# Patient Record
Sex: Female | Born: 1955 | Race: Black or African American | Hispanic: No | Marital: Single | Smoking: Never smoker
Health system: Southern US, Community
[De-identification: ages and names within clinical notes are randomized; demographics above are authoritative.]

## PROBLEM LIST (undated history)

## (undated) DIAGNOSIS — N6019 Diffuse cystic mastopathy of unspecified breast: Secondary | ICD-10-CM

## (undated) DIAGNOSIS — K5904 Chronic idiopathic constipation: Secondary | ICD-10-CM

## (undated) HISTORY — PX: OTHER SURGICAL HISTORY: SHX169

## (undated) HISTORY — PX: ABDOMINAL HYSTERECTOMY: SHX81

## (undated) HISTORY — DX: Chronic idiopathic constipation: K59.04

## (undated) HISTORY — DX: Diffuse cystic mastopathy of unspecified breast: N60.19

## (undated) HISTORY — PX: BREAST EXCISIONAL BIOPSY: SUR124

## (undated) HISTORY — PX: KNEE ARTHROSCOPY: SUR90

---

## 1997-07-23 ENCOUNTER — Ambulatory Visit: Admission: RE | Admit: 1997-07-23 | Discharge: 1997-07-23 | Payer: Self-pay | Admitting: Internal Medicine

## 1998-10-22 ENCOUNTER — Ambulatory Visit (HOSPITAL_COMMUNITY): Admission: RE | Admit: 1998-10-22 | Discharge: 1998-10-22 | Payer: Self-pay | Admitting: Obstetrics and Gynecology

## 1999-11-08 ENCOUNTER — Encounter: Admission: RE | Admit: 1999-11-08 | Discharge: 1999-11-08 | Payer: Self-pay | Admitting: Internal Medicine

## 1999-11-08 ENCOUNTER — Encounter: Payer: Self-pay | Admitting: Internal Medicine

## 2000-11-09 ENCOUNTER — Encounter: Admission: RE | Admit: 2000-11-09 | Discharge: 2000-11-09 | Payer: Self-pay | Admitting: Obstetrics and Gynecology

## 2000-11-09 ENCOUNTER — Encounter: Payer: Self-pay | Admitting: Obstetrics and Gynecology

## 2000-11-15 ENCOUNTER — Encounter (INDEPENDENT_AMBULATORY_CARE_PROVIDER_SITE_OTHER): Payer: Self-pay | Admitting: Specialist

## 2000-11-15 ENCOUNTER — Inpatient Hospital Stay (HOSPITAL_COMMUNITY): Admission: RE | Admit: 2000-11-15 | Discharge: 2000-11-17 | Payer: Self-pay | Admitting: Obstetrics and Gynecology

## 2001-06-06 ENCOUNTER — Encounter: Admission: RE | Admit: 2001-06-06 | Discharge: 2001-06-06 | Payer: Self-pay | Admitting: Internal Medicine

## 2001-06-06 ENCOUNTER — Encounter: Payer: Self-pay | Admitting: Internal Medicine

## 2001-11-29 ENCOUNTER — Encounter: Payer: Self-pay | Admitting: Obstetrics and Gynecology

## 2001-11-29 ENCOUNTER — Encounter: Admission: RE | Admit: 2001-11-29 | Discharge: 2001-11-29 | Payer: Self-pay | Admitting: Obstetrics and Gynecology

## 2003-01-22 ENCOUNTER — Encounter: Admission: RE | Admit: 2003-01-22 | Discharge: 2003-01-22 | Payer: Self-pay | Admitting: Obstetrics and Gynecology

## 2004-02-11 ENCOUNTER — Ambulatory Visit (HOSPITAL_COMMUNITY): Admission: RE | Admit: 2004-02-11 | Discharge: 2004-02-11 | Payer: Self-pay | Admitting: Obstetrics and Gynecology

## 2004-02-19 ENCOUNTER — Encounter: Admission: RE | Admit: 2004-02-19 | Discharge: 2004-02-19 | Payer: Self-pay | Admitting: Obstetrics and Gynecology

## 2004-09-07 ENCOUNTER — Encounter: Admission: RE | Admit: 2004-09-07 | Discharge: 2004-09-07 | Payer: Self-pay | Admitting: Obstetrics and Gynecology

## 2005-02-25 ENCOUNTER — Encounter: Payer: Self-pay | Admitting: Cardiology

## 2005-02-25 ENCOUNTER — Ambulatory Visit: Payer: Self-pay

## 2005-03-07 ENCOUNTER — Encounter: Admission: RE | Admit: 2005-03-07 | Discharge: 2005-03-07 | Payer: Self-pay | Admitting: Obstetrics and Gynecology

## 2005-09-23 ENCOUNTER — Encounter: Admission: RE | Admit: 2005-09-23 | Discharge: 2005-09-23 | Payer: Self-pay | Admitting: Internal Medicine

## 2006-05-23 ENCOUNTER — Encounter: Admission: RE | Admit: 2006-05-23 | Discharge: 2006-05-23 | Payer: Self-pay | Admitting: Internal Medicine

## 2007-01-01 ENCOUNTER — Encounter: Admission: RE | Admit: 2007-01-01 | Discharge: 2007-01-01 | Payer: Self-pay | Admitting: Obstetrics and Gynecology

## 2007-09-09 ENCOUNTER — Ambulatory Visit: Payer: Self-pay | Admitting: Vascular Surgery

## 2007-09-09 ENCOUNTER — Encounter (INDEPENDENT_AMBULATORY_CARE_PROVIDER_SITE_OTHER): Payer: Self-pay | Admitting: Emergency Medicine

## 2007-09-09 ENCOUNTER — Emergency Department (HOSPITAL_COMMUNITY): Admission: EM | Admit: 2007-09-09 | Discharge: 2007-09-09 | Payer: Self-pay | Admitting: Emergency Medicine

## 2008-02-19 ENCOUNTER — Encounter: Admission: RE | Admit: 2008-02-19 | Discharge: 2008-02-19 | Payer: Self-pay | Admitting: Obstetrics and Gynecology

## 2009-01-01 ENCOUNTER — Ambulatory Visit: Payer: Self-pay | Admitting: Internal Medicine

## 2009-03-12 ENCOUNTER — Encounter: Admission: RE | Admit: 2009-03-12 | Discharge: 2009-03-12 | Payer: Self-pay | Admitting: Internal Medicine

## 2009-03-30 ENCOUNTER — Ambulatory Visit: Payer: Self-pay | Admitting: Internal Medicine

## 2010-04-19 ENCOUNTER — Other Ambulatory Visit: Payer: Self-pay | Admitting: *Deleted

## 2010-04-19 ENCOUNTER — Other Ambulatory Visit: Payer: Self-pay | Admitting: Obstetrics and Gynecology

## 2010-04-19 DIAGNOSIS — Z1239 Encounter for other screening for malignant neoplasm of breast: Secondary | ICD-10-CM

## 2010-05-07 ENCOUNTER — Ambulatory Visit
Admission: RE | Admit: 2010-05-07 | Discharge: 2010-05-07 | Disposition: A | Discharge status: Home / Self Care | Payer: BC Managed Care – PPO | Source: Ambulatory Visit | Attending: *Deleted | Admitting: *Deleted

## 2010-05-07 ENCOUNTER — Other Ambulatory Visit: Payer: Self-pay | Admitting: Internal Medicine

## 2010-05-07 DIAGNOSIS — Z1239 Encounter for other screening for malignant neoplasm of breast: Secondary | ICD-10-CM

## 2010-05-07 LAB — HM MAMMOGRAPHY

## 2010-08-06 NOTE — H&P (Signed)
[facility]  Patient:    Laura Meyers, SITZER Visit Number: [id number] MRN: [id number]          Service Type: Attending:  Katherine Roan, M.D. Dictated by:   S. Kyra Manges, M.D. Adm. Date:  11/15/00                           History and Physical  CHIEF COMPLAINT:  Uterine fibroids.  HISTORY OF PRESENT ILLNESS:  Ms. Vezina is a 55 year old, gravida 1, female who has a history of enlarging uterine fibroids to the point now that the uterus is about 14-week size which occupies the entire cul-de-sac and hysterectomy is recommended.  In August 2000 she had a hysteroscopy which revealed a smooth uterine cavity.  This was resected with little irregularity posteriorly which was resected.  This controlled her periods somewhat, though the uterus continues to enlarge and on last ultrasound the uterine measurements were 16 cm.  PAST MEDICAL HISTORY:  She has a history of right knee arthroscopy in 1999.  ALLERGIES:  No known drug allergies.  CURRENT MEDICATIONS:  None.  REVIEW OF SYSTEMS:  HEENT:  She wears glasses, but has noted no decrease in visual or auditory acuity.  HEART:  No hypertension, no mitral valve prolapse, no history of rheumatic fever or hypertension.  No chest pain.  LUNGS:  No chronic cough, no asthma, no history of hemoptysis or pneumonia.  GU:  No stress incontinence, frequency.  GI:  No bowel habit change, no melena, no anorexia, no history of reflux.  MUSCLES/BONES/JOINTS:  She has had an arthroscopy which revealed a meniscal tear according to the patient.  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  FAMILY HISTORY:  Her mother is deceased with breast cancer at 39 and father died at 31 of emphysema.  She has two brothers; one with hypertension and two sisters with hypertension.  No diabetes.  PHYSICAL EXAMINATION:  VITAL SIGNS:  Weight 197, blood pressure 120/80, pulse 80.  GENERAL:  Well-developed,  well-nourished female in no acute distress.  Alert and oriented to time, place, and recent events.  Appears to be her stated age.  HEENT:  Unremarkable.  Oropharynx is not injected.  NECK:  Supple.  Carotid pulses are equal without bruits.  Thyroid is not enlarged.  No adenopathy appreciated.  Good range of motion.  BREASTS:  No masses or tenderness.  LUNGS:  Clear to auscultation and percussion.  Diaphragm move well with inspiration and expiration.  HEART:  Normal sinus rhythm, no murmurs.  ABDOMEN:  Soft, liver, spleen, and kidney are not palpated.  The uterus is palpated about three fingerbreadths above the umbilicus.  Bowel sounds are normal, no tenderness, no rebound or guarding.  Femoral pulses are equal without bruits.  EXTREMITIES:  Good range of motion and equal pulses and reflexes.  PELVIC:  Normal vulva and vagina.  The cervix without lesions.  Recent Pap smear negative.  Uterus is enlarged to about 14-week size, nontender.  IMPRESSION:  Large uterine fibroids.  PLAN:  Abdominal hysterectomy.  Risks and benefits discussed with patient at length. Dictated by:   S. Kyra Manges, M.D. Attending:  Katherine Roan, M.D. DD:  11/15/00 TD:  11/15/00 Job: [id number]

## 2010-08-06 NOTE — Discharge Summary (Signed)
[facility]  Patient:    Laura Meyers, WITHERELL [facility] Visit Number: [id number] MRN: [id number]          Service Type: GYN Location: 4W 0448 01 Attending Physician:  Lendon Colonel Dictated by:   Kathie Rhodes. Kyra Manges, M.D. Admit Date:  11/15/2000 Discharge Date: 11/17/2000                             Discharge Summary  HISTORY OF PRESENT ILLNESS:  The patient is a 55 year old female with an enlarging uterine fibroid found, and surgery was recommended.  Pap smear was normal.  Preoperative hemoglobin was 12.6, hematocrit 37.  She was A+.  HOSPITAL COURSE:  The patient was admitted to the hospital, underwent an uneventful hysterectomy.  Total uterine weight was significant, well over 500 g.  Her postoperative course was uncomplicated.  She remained afebrile and without complaints.  On the day of discharge her incision was healing nicely, and she was afebrile and ambulating without difficulty.  She was given Percocet for pain.  Told to call for fever, or nausea or vomiting, or any other difficulties she could encounter.  She will return to the office in two weeks.  CONDITION ON DISCHARGE:  Improved. Dictated by:   S. Kyra Manges, M.D. Attending Physician:  Lendon Colonel DD:  11/23/00 TD:  11/24/00 Job: [id number]

## 2010-08-06 NOTE — Op Note (Signed)
[facility]  Patient:    Laura Meyers, Laura Meyers [facility] Visit Number: [id number] MRN: [id number]          Service Type: GYN Location: 4W 0448 01 Attending Physician:  Lendon Colonel Proc. Date: 11/15/00 Adm. Date:  11/15/2000                             Operative Report  PREOPERATIVE DIAGNOSIS:  Large uterine fibroids.  POSTOPERATIVE DIAGNOSIS:  Large uterine fibroids.  OPERATION PERFORMED:  Abdominal hysterectomy.  DESCRIPTION OF PROCEDURE:  The patient was placed in lithotomy position and prepped and draped in the usual fashion. A generous Pfannenstiel incision was made in the abdomen and extended in layers to the fascia which was incised transversely. Hemostasis with the Bovie. The peritoneum was entered vertically, upper abdomen explored and found to be within normal limits. Parietoperitoneum the Balfour retractor was inserted with the extender. The uterus was enlarged particularly with the left fundal fibroid and a left interligamentous fibroid. Because of visibility difficulties, I injected the uterus with a Pitressin solution and removed 2 or 3 fibroids so that we could facilitate removing the uterus. This was done with clamping the utero-ovarian anastomosis and transecting this along with the round ligament. A bladder flap was created. Uterine vessels were skeletonized and ligated. The cardinal and uterosacral ligaments were then clamped and ligated and the specimen was removed from the operative field. The vagina was transfixed with #0 chromic and #0 Vicryl and the uterosacral ligaments and cul-de-sac were plicated with 2-0 Ethibond. This affected and excellent vault support. Reperitonealization with 3-0 Vicryl. Hemostasis was secured. The parietoperitoneum was then closed with 2-0 PDS, the fascia was closed with 2-0 PDS continuous and interrupted sutures. The subcutaneum was approximated with 3-0 Vicryl and the skin was then closed with 4-0 PDS.  A subcuticular suture was employed. A dry sterile dressing was applied. The incision was infiltrated with 20 cc of 0.5% Marcaine with epinephrine. Caterine tolerated this procedure well and was sent to the recovery room in good condition. Attending Physician:  Lendon Colonel DD:  11/15/00 TD:  11/15/00 Job: [id number]

## 2011-02-09 ENCOUNTER — Encounter: Payer: Self-pay | Admitting: Internal Medicine

## 2011-02-09 ENCOUNTER — Ambulatory Visit (INDEPENDENT_AMBULATORY_CARE_PROVIDER_SITE_OTHER): Payer: BC Managed Care – PPO | Admitting: Internal Medicine

## 2011-02-09 VITALS — BP 110/74 | HR 76 | Temp 98.5°F | Ht 68.75 in | Wt 207.0 lb

## 2011-02-09 DIAGNOSIS — J069 Acute upper respiratory infection, unspecified: Secondary | ICD-10-CM

## 2011-02-09 NOTE — Progress Notes (Signed)
  Subjective:    Patient ID: Laura Meyers, female    DOB: 1955/11/02, 55 y.o.   MRN: [id number]  HPI 55 year old black female in today with URI symptoms. Initially had runny nose but now has cough and congestion in chest. No fever or shaking chills. Last URI was in 2010. General health is excellent. Says she was to have physical exam in January and screening colonoscopy. We will arrange colonoscopy for her in January. No sputum production. No sore throat or ear pain.    Review of Systems     Objective:   Physical Exam HEENT exam: TMs are slightly full but not red; pharynx is clear; neck is supple without significant adenopathy; chest is clear to auscultation without rales or wheezing        Assessment & Plan:  Upper respiratory infection  Plan: Zithromax Z-Pak 2 tablets by mouth day one followed by 1 tablet by mouth days 2 through 5 with 1 refill. If not better in one week have prescription refill. Samples of Rezira (5 doses) 1 teaspoon by mouth each bedtime for cough. Schedule physical examination for January and colonoscopy with Dr. Juanda Chance for January as well.

## 2011-02-09 NOTE — Patient Instructions (Signed)
Take Zithromax Z-PAK as directed. Not better in one week have prescription refill. Take cough syrup at bedtime for 5 days.

## 2011-02-15 ENCOUNTER — Telehealth: Payer: Self-pay

## 2011-02-15 ENCOUNTER — Encounter: Payer: Self-pay | Admitting: Internal Medicine

## 2011-02-15 NOTE — Telephone Encounter (Signed)
Patient scheduled for screening colonoscopy on March 25, 2011 at 1:30 pm with Dr, Diamond Nickel per her request. Patient informed

## 2011-03-11 ENCOUNTER — Encounter: Payer: Self-pay | Admitting: Internal Medicine

## 2011-03-11 ENCOUNTER — Ambulatory Visit (AMBULATORY_SURGERY_CENTER): Payer: BC Managed Care – PPO | Admitting: *Deleted

## 2011-03-11 VITALS — Ht 71.0 in | Wt 210.1 lb

## 2011-03-11 DIAGNOSIS — Z1211 Encounter for screening for malignant neoplasm of colon: Secondary | ICD-10-CM

## 2011-03-11 MED ORDER — PEG-KCL-NACL-NASULF-NA ASC-C 100 G PO SOLR: ORAL | Status: DC

## 2011-03-25 ENCOUNTER — Ambulatory Visit (AMBULATORY_SURGERY_CENTER): Payer: BC Managed Care – PPO | Admitting: Internal Medicine

## 2011-03-25 ENCOUNTER — Encounter: Payer: Self-pay | Admitting: Internal Medicine

## 2011-03-25 VITALS — BP 119/68 | HR 72 | Temp 98.7°F | Resp 12 | Ht 71.0 in | Wt 210.0 lb

## 2011-03-25 DIAGNOSIS — Z1211 Encounter for screening for malignant neoplasm of colon: Secondary | ICD-10-CM

## 2011-03-25 MED ORDER — SODIUM CHLORIDE 0.9 % IV SOLN: 500.0000 mL | INTRAVENOUS | Status: DC

## 2011-03-25 NOTE — Patient Instructions (Signed)
Follow green and blue discharge instructions, follow high fiber diet given with discharge instruction, repeat colonoscopy in 10 years.

## 2011-03-25 NOTE — Op Note (Signed)
[facility] [address] [place], [place]  [address]  COLONOSCOPY PROCEDURE REPORT  PATIENT:  Laura Meyers, Laura Meyers  MR#:  [id number] BIRTHDATE:  [birth year], 55 yrs. old  GENDER:  female ENDOSCOPIST:  Hedwig Morton. Juanda Chance, MD REF. BY:  Sharlet Salina, M.D. PROCEDURE DATE:  03/25/2011 PROCEDURE:  Colonoscopy 19147 ASA CLASS:  Class I INDICATIONS:  colorectal cancer screening, average risk MEDICATIONS:   These medications were titrated to patient response per physician's verbal order, Versed 9 mg, Fentanyl 75 mcg  DESCRIPTION OF PROCEDURE:   After the risks and benefits and of the procedure were explained, informed consent was obtained. Digital rectal exam was performed and revealed no rectal masses. The LB PCF-[id number] endoscope was introduced through the anus and advanced to the cecum, which was identified by both the appendix and ileocecal valve.  The quality of the prep was good, using MoviPrep.  The instrument was then slowly withdrawn as the colon was fully examined. <<PROCEDUREIMAGES>>  FINDINGS:  No polyps or cancers were seen (see image1, image2, image3, image4, and image5).   Retroflexed views in the rectum revealed no abnormalities.    The scope was then withdrawn from the patient and the procedure completed.  COMPLICATIONS:  None ENDOSCOPIC IMPRESSION: 1) No polyps or cancers 2) Normal colonoscopy RECOMMENDATIONS: 1) High fiber diet.  REPEAT EXAM:  In 10 year(s) for.  ______________________________ Hedwig Morton. Juanda Chance, MD  CC:  n. eSIGNED:   Hedwig Morton. Brodie at 03/25/2011 02:05 PM  Jeremy Johann, [phone]

## 2011-03-25 NOTE — Progress Notes (Signed)
Patient did not experience any of the following events: a burn prior to discharge; a fall within the facility; wrong site/side/patient/procedure/implant event; or a hospital transfer or hospital admission upon discharge from the facility. (G8907) Patient did not have preoperative order for IV antibiotic SSI prophylaxis. (G8918)  

## 2011-03-28 ENCOUNTER — Telehealth: Payer: Self-pay

## 2011-03-28 NOTE — Telephone Encounter (Signed)
Follow up Call- Patient questions:  Do you have a fever, pain , or abdominal swelling? no Pain Score  0 *  Have you tolerated food without any problems? yes  Have you been able to return to your normal activities? yes  Do you have any questions about your discharge instructions: Diet   no Medications  no Follow up visit  no  Do you have questions or concerns about your Care? no  Actions: * If pain score is 4 or above: No action needed, pain <4.  "Everything went fine, I'm already at work" per the pt. Laura Meyers

## 2011-03-30 ENCOUNTER — Other Ambulatory Visit: Payer: BC Managed Care – PPO | Admitting: Internal Medicine

## 2011-03-31 ENCOUNTER — Encounter: Payer: BC Managed Care – PPO | Admitting: Internal Medicine

## 2011-04-18 ENCOUNTER — Other Ambulatory Visit: Payer: Self-pay | Admitting: Internal Medicine

## 2011-04-18 ENCOUNTER — Encounter: Payer: Self-pay | Admitting: Internal Medicine

## 2011-04-18 ENCOUNTER — Ambulatory Visit (INDEPENDENT_AMBULATORY_CARE_PROVIDER_SITE_OTHER): Payer: BC Managed Care – PPO | Admitting: Internal Medicine

## 2011-04-18 VITALS — BP 118/78 | HR 76 | Ht 69.5 in | Wt 210.0 lb

## 2011-04-18 DIAGNOSIS — G4762 Sleep related leg cramps: Secondary | ICD-10-CM

## 2011-04-18 DIAGNOSIS — Z8639 Personal history of other endocrine, nutritional and metabolic disease: Secondary | ICD-10-CM

## 2011-04-18 DIAGNOSIS — N6019 Diffuse cystic mastopathy of unspecified breast: Secondary | ICD-10-CM

## 2011-04-18 DIAGNOSIS — Z1272 Encounter for screening for malignant neoplasm of vagina: Secondary | ICD-10-CM

## 2011-04-18 DIAGNOSIS — Z0000 Encounter for general adult medical examination without abnormal findings: Secondary | ICD-10-CM

## 2011-04-18 DIAGNOSIS — K5904 Chronic idiopathic constipation: Secondary | ICD-10-CM

## 2011-04-18 LAB — COMPREHENSIVE METABOLIC PANEL
AST: 19 U/L (ref 0–37)
Alkaline Phosphatase: 78 U/L (ref 39–117)
BUN: 11 mg/dL (ref 6–23)
Calcium: 9.9 mg/dL (ref 8.4–10.5)
Chloride: 104 mEq/L (ref 96–112)
Creat: 0.71 mg/dL (ref 0.50–1.10)
Glucose, Bld: 88 mg/dL (ref 70–99)

## 2011-04-18 LAB — LIPID PANEL
HDL: 87 mg/dL (ref >39)
Total CHOL/HDL Ratio: 2.5 Ratio
Triglycerides: 56 mg/dL (ref <150)

## 2011-04-18 LAB — POCT URINALYSIS DIPSTICK
Blood, UA: NEGATIVE
Ketones, UA: NEGATIVE
Protein, UA: NEGATIVE
Spec Grav, UA: 1.015
pH, UA: 6

## 2011-04-18 LAB — TSH: TSH: 2.452 u[IU]/mL (ref 0.350–4.500)

## 2011-04-18 LAB — CBC WITH DIFFERENTIAL/PLATELET
Basophils Absolute: 0 10*3/uL (ref 0.0–0.1)
Basophils Relative: 0 % (ref 0–1)
Eosinophils Relative: 1 % (ref 0–5)
HCT: 41.7 % (ref 36.0–46.0)
MCHC: 31.9 g/dL (ref 30.0–36.0)
MCV: 74.2 fL — ABNORMAL LOW (ref 78.0–100.0)
Monocytes Absolute: 0.3 10*3/uL (ref 0.1–1.0)
RDW: 15 % (ref 11.5–15.5)

## 2011-04-18 NOTE — Progress Notes (Signed)
  Subjective:    Patient ID: Laura Meyers, female    DOB: Aug 25, 1955, 56 y.o.   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 Hx of functional constipation ,fibrocystic breast disease, Vitamin D deficiency and musculoskeletal pain. Recent colonoscopy dine 03/25/11. Declines flu vaccine. Tdap given Jan 2011. Mammogram due 2/13. NKDA Hx hysterectomy without oophorectomy for fibroids 2002. Benign Right breast biopsy 2002. Hx surgery for left hammertoe. 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 Thurston Hole July 1995. Divorced. No children. Does not smoke or consume alcohol. Family History: Mother died of breast cancer in her 39's with history of hypertension. Sister with hypertension.. Father died of COPD in his 42's.Tota lof 2 brothers and 2 sisters.    Review of Systems  Constitutional: Negative.   HENT: Negative.   Eyes: Negative.   Respiratory: Negative.   Cardiovascular: Negative.   Genitourinary: Negative.   Musculoskeletal:       Leg cramps at night.  Neurological:       History of benign positional vertigo.  Hematological: Negative.   Psychiatric/Behavioral: Negative.        Objective:   Physical Exam  Vitals reviewed. Constitutional: She is oriented to person, place, and time. She appears well-developed and well-nourished.  HENT:  Head: Normocephalic and atraumatic.  Right Ear: External ear normal.  Left Ear: External ear normal.  Mouth/Throat: Oropharynx is clear and moist.  Eyes: Conjunctivae and EOM are normal. Pupils are equal, round, and reactive to light. Right eye exhibits no discharge. Left eye exhibits no discharge. No scleral icterus.  Neck: Normal range of motion. Neck supple. No JVD present. No thyromegaly present.  Cardiovascular: Normal rate, regular rhythm, normal heart sounds and intact distal pulses.   Pulmonary/Chest: No respiratory distress. She has no wheezes. She has  no rales. She exhibits no tenderness.       Breasts normal female with fibrocystic changes and no masses.  Abdominal: Soft. Bowel sounds are normal. She exhibits no distension and no mass. There is no tenderness. There is no rebound and no guarding.  Genitourinary:       Pap taken of vaginal cuff; no masses on bimanual exam; a stitch was removed from inside the vagina which was likely a remnant of her previous hysterectomy that worked its way out  Musculoskeletal: Normal range of motion. She exhibits no edema.  Lymphadenopathy:    She has no cervical adenopathy.  Neurological: She is alert and oriented to person, place, and time. She has normal reflexes. She displays normal reflexes. No cranial nerve deficit. Coordination normal.  Skin: Skin is warm and dry.  Psychiatric: She has a normal mood and affect. Her behavior is normal. Judgment and thought content normal.          Assessment & Plan:  Fibrocystic breast disease and family Hx breast cancer in mother Functional constipation  Nocturnal leg cramps Hx benign positional vertigo Hx of musculoskeletal pain Hx of Vitamin D deficiency Plan: mammogram due Feb 2013, Check Vitamin D level with hx leg cramps. Leg cramps are benign spasms but can be treated with Mirapex if necessary. Return one year or as needed.

## 2011-04-19 ENCOUNTER — Other Ambulatory Visit (HOSPITAL_COMMUNITY)
Admission: RE | Admit: 2011-04-19 | Discharge: 2011-04-19 | Disposition: A | Discharge status: Home / Self Care | Payer: BC Managed Care – PPO | Source: Ambulatory Visit | Attending: Internal Medicine | Admitting: Internal Medicine

## 2011-04-19 DIAGNOSIS — Z01419 Encounter for gynecological examination (general) (routine) without abnormal findings: Secondary | ICD-10-CM | POA: Insufficient documentation

## 2011-04-19 LAB — IRON AND TIBC: TIBC: 306 ug/dL (ref 250–470)

## 2011-05-06 ENCOUNTER — Telehealth: Payer: Self-pay

## 2011-05-06 NOTE — Telephone Encounter (Signed)
Patient is requesting a copy of her PE done on 01/28 2013

## 2011-05-07 ENCOUNTER — Encounter: Payer: Self-pay | Admitting: Internal Medicine

## 2011-05-07 DIAGNOSIS — K5904 Chronic idiopathic constipation: Secondary | ICD-10-CM | POA: Insufficient documentation

## 2011-05-07 DIAGNOSIS — N6019 Diffuse cystic mastopathy of unspecified breast: Secondary | ICD-10-CM | POA: Insufficient documentation

## 2011-05-07 DIAGNOSIS — Z8639 Personal history of other endocrine, nutritional and metabolic disease: Secondary | ICD-10-CM | POA: Insufficient documentation

## 2011-05-07 DIAGNOSIS — G4762 Sleep related leg cramps: Secondary | ICD-10-CM | POA: Insufficient documentation

## 2011-05-07 NOTE — Patient Instructions (Signed)
Call if leg cramps persist. Obtain mammogram Feb 2013. Continue same meds. Return in one year.

## 2011-05-09 NOTE — Telephone Encounter (Signed)
Note completed 

## 2011-06-24 ENCOUNTER — Other Ambulatory Visit: Payer: Self-pay | Admitting: Internal Medicine

## 2011-06-24 DIAGNOSIS — Z1231 Encounter for screening mammogram for malignant neoplasm of breast: Secondary | ICD-10-CM

## 2011-07-01 ENCOUNTER — Ambulatory Visit: Payer: BC Managed Care – PPO

## 2013-01-15 ENCOUNTER — Other Ambulatory Visit: Payer: Self-pay

## 2013-01-15 DIAGNOSIS — Z1231 Encounter for screening mammogram for malignant neoplasm of breast: Secondary | ICD-10-CM

## 2013-02-22 ENCOUNTER — Ambulatory Visit
Admission: RE | Admit: 2013-02-22 | Discharge: 2013-02-22 | Disposition: A | Discharge status: Home / Self Care | Payer: BC Managed Care – PPO | Source: Ambulatory Visit

## 2013-02-22 DIAGNOSIS — Z1231 Encounter for screening mammogram for malignant neoplasm of breast: Secondary | ICD-10-CM

## 2015-02-16 ENCOUNTER — Other Ambulatory Visit: Payer: Self-pay

## 2015-02-16 DIAGNOSIS — Z1231 Encounter for screening mammogram for malignant neoplasm of breast: Secondary | ICD-10-CM

## 2015-02-20 ENCOUNTER — Ambulatory Visit
Admission: RE | Admit: 2015-02-20 | Discharge: 2015-02-20 | Disposition: A | Discharge status: Home / Self Care | Payer: BC Managed Care – PPO | Source: Ambulatory Visit

## 2015-02-20 DIAGNOSIS — Z1231 Encounter for screening mammogram for malignant neoplasm of breast: Secondary | ICD-10-CM

## 2015-03-02 ENCOUNTER — Ambulatory Visit (INDEPENDENT_AMBULATORY_CARE_PROVIDER_SITE_OTHER): Payer: BC Managed Care – PPO | Admitting: Podiatry

## 2015-03-02 ENCOUNTER — Ambulatory Visit (INDEPENDENT_AMBULATORY_CARE_PROVIDER_SITE_OTHER): Payer: BC Managed Care – PPO

## 2015-03-02 ENCOUNTER — Encounter: Payer: Self-pay | Admitting: Podiatry

## 2015-03-02 VITALS — BP 132/72 | HR 70 | Resp 16 | Ht 71.0 in | Wt 180.0 lb

## 2015-03-02 DIAGNOSIS — M79671 Pain in right foot: Secondary | ICD-10-CM | POA: Diagnosis not present

## 2015-03-02 DIAGNOSIS — M779 Enthesopathy, unspecified: Secondary | ICD-10-CM | POA: Diagnosis not present

## 2015-03-02 DIAGNOSIS — M2042 Other hammer toe(s) (acquired), left foot: Secondary | ICD-10-CM

## 2015-03-02 MED ORDER — TRIAMCINOLONE ACETONIDE 10 MG/ML IJ SUSP
10.0000 mg | Freq: Once | INTRAMUSCULAR | Status: AC
  Administered 2015-03-02: 10 mg

## 2015-03-02 NOTE — Progress Notes (Signed)
   Subjective:    Patient ID: Laura Meyers, female    DOB: 06-07-1955, 59 y.o.   MRN: [id number]  HPI Patient presents with foot pain in their left foot, ball of foot. Pt stated, "feels bones when walking on foot"; x3 months.  Patient also presents with warts on their right foot, bottom of their foot. x6 months.   Review of Systems  All other systems reviewed and are negative.      Objective:   Physical Exam        Assessment & Plan:

## 2015-03-03 NOTE — Progress Notes (Signed)
Subjective:     Patient ID: Laura Meyers, female   DOB: 20-Mar-1956, 59 y.o.   MRN: [id number]  HPI patient presents stating I'm having a lot of pain on the bottom of my left foot for several months and I have all these lesions on the bottom of the right foot that I don't know what they are   Review of Systems  All other systems reviewed and are negative.      Objective:   Physical Exam  Constitutional: She is oriented to person, place, and time.  Cardiovascular: Intact distal pulses.   Musculoskeletal: Normal range of motion.  Neurological: She is oriented to person, place, and time.  Skin: Skin is warm.  Nursing note and vitals reviewed.  neurovascular status intact muscle strength adequate range of motion within normal limits with patient noted to have quite a bit of inflammation around the second MPJ of the left foot with fluid buildup with minimal displacement of the digit. Right foot shows numerous keratotic lesions that have lucent-type core worse and patient is noted to be well oriented 3 with good digital perfusion     Assessment:     Inflammatory capsulitis left foot with fluid buildup and porokeratotic type lesions left    Plan:     H&P and both conditions discussed and education rendered along with x-ray evaluation discussed. Today I did a proximal nerve block of left explaining risk of capsular injection aspirated the joint getting out a small amount of clear fluid and injected with a quarter cc dexamethasone Kenalog and talked about the possibility for long-term orthotic therapy. Dispensed pads with instructions on usage and reappoint to recheck

## 2015-03-18 ENCOUNTER — Encounter: Payer: Self-pay | Admitting: Podiatry

## 2015-03-18 ENCOUNTER — Ambulatory Visit (INDEPENDENT_AMBULATORY_CARE_PROVIDER_SITE_OTHER): Payer: BC Managed Care – PPO | Admitting: Podiatry

## 2015-03-18 VITALS — BP 114/75 | HR 69 | Resp 16

## 2015-03-18 DIAGNOSIS — M2042 Other hammer toe(s) (acquired), left foot: Secondary | ICD-10-CM | POA: Diagnosis not present

## 2015-03-18 DIAGNOSIS — M779 Enthesopathy, unspecified: Secondary | ICD-10-CM

## 2015-03-18 DIAGNOSIS — M79671 Pain in right foot: Secondary | ICD-10-CM | POA: Diagnosis not present

## 2015-03-18 MED ORDER — DICLOFENAC SODIUM 75 MG PO TBEC: 75.0000 mg | DELAYED_RELEASE_TABLET | Freq: Two times a day (BID) | ORAL | Status: DC

## 2015-03-19 NOTE — Progress Notes (Signed)
Subjective:     Patient ID: Laura Meyers, female   DOB: 08/18/1955, 59 y.o.   MRN: [id number]  HPI patient states I'm still having a lot of pain in my left foot with improvement after you gave me the injection but it feels like all the bones are sore and this is been going on now for several months.   Review of Systems     Objective:   Physical Exam Neurovascular status unchanged with continued discomfort in the left forefoot around the third metatarsal phalangeal joint moderate in the second with diminished fat pad noted and pain to palpation of the metatarsals themselves    Assessment:     Acute inflammatory capsulitis left over right with structural changes leading to excess pressure against the metatarsals    Plan:     H&P and conditions reviewed with patient. Today I have recommended immobilization to reduce all plantar pressures and I applied air fracture walker to provide for a complete rigid plantar surface and not allow and the bending forces occurring from the leg. I then went ahead and scanned for custom orthotics to try to reduce plantar pressure and patient will be seen back when they are ready or earlier if any issues should occur

## 2015-03-27 ENCOUNTER — Other Ambulatory Visit: Payer: BC Managed Care – PPO | Admitting: Internal Medicine

## 2015-03-27 DIAGNOSIS — Z13220 Encounter for screening for lipoid disorders: Secondary | ICD-10-CM

## 2015-03-27 DIAGNOSIS — Z130 Encounter for screening for diseases of the blood and blood-forming organs and certain disorders involving the immune mechanism: Secondary | ICD-10-CM

## 2015-03-27 DIAGNOSIS — Z0000 Encounter for general adult medical examination without abnormal findings: Secondary | ICD-10-CM

## 2015-03-27 DIAGNOSIS — E559 Vitamin D deficiency, unspecified: Secondary | ICD-10-CM

## 2015-03-27 LAB — COMPLETE METABOLIC PANEL WITH GFR
ALT: 20 U/L (ref 6–29)
AST: 26 U/L (ref 10–35)
Albumin: 4.1 g/dL (ref 3.6–5.1)
Alkaline Phosphatase: 78 U/L (ref 33–130)
BUN: 18 mg/dL (ref 7–25)
CHLORIDE: 104 mmol/L (ref 98–110)
CO2: 26 mmol/L (ref 20–31)
Calcium: 9.6 mg/dL (ref 8.6–10.4)
Creat: 0.8 mg/dL (ref 0.50–1.05)
GFR, Est African American: >89 mL/min (ref >=60)
GFR, Est Non African American: 82 mL/min (ref >=60)
GLUCOSE: 89 mg/dL (ref 65–99)
POTASSIUM: 4.2 mmol/L (ref 3.5–5.3)
SODIUM: 140 mmol/L (ref 135–146)
Total Bilirubin: 0.6 mg/dL (ref 0.2–1.2)
Total Protein: 7.1 g/dL (ref 6.1–8.1)

## 2015-03-27 LAB — CBC WITH DIFFERENTIAL/PLATELET
BASOS ABS: 0 10*3/uL (ref 0.0–0.1)
Basophils Relative: 0 % (ref 0–1)
Eosinophils Absolute: 0.1 10*3/uL (ref 0.0–0.7)
Eosinophils Relative: 1 % (ref 0–5)
HEMATOCRIT: 38.9 % (ref 36.0–46.0)
HEMOGLOBIN: 12.4 g/dL (ref 12.0–15.0)
LYMPHS ABS: 1.2 10*3/uL (ref 0.7–4.0)
LYMPHS PCT: 16 % (ref 12–46)
MCH: 23.7 pg — ABNORMAL LOW (ref 26.0–34.0)
MCHC: 31.9 g/dL (ref 30.0–36.0)
MCV: 74.2 fL — ABNORMAL LOW (ref 78.0–100.0)
MPV: 10 fL (ref 8.6–12.4)
Monocytes Absolute: 0.3 10*3/uL (ref 0.1–1.0)
Monocytes Relative: 4 % (ref 3–12)
NEUTROS ABS: 6.2 10*3/uL (ref 1.7–7.7)
Neutrophils Relative %: 79 % — ABNORMAL HIGH (ref 43–77)
Platelets: 248 10*3/uL (ref 150–400)
RBC: 5.24 MIL/uL — ABNORMAL HIGH (ref 3.87–5.11)
RDW: 15.9 % — ABNORMAL HIGH (ref 11.5–15.5)
WBC: 7.8 10*3/uL (ref 4.0–10.5)

## 2015-03-27 LAB — LIPID PANEL
CHOL/HDL RATIO: 2 ratio (ref <=5.0)
Cholesterol: 194 mg/dL (ref 125–200)
HDL: 96 mg/dL (ref >=46)
LDL CALC: 88 mg/dL (ref <130)
Triglycerides: 48 mg/dL (ref <150)
VLDL: 10 mg/dL (ref <30)

## 2015-03-27 LAB — TSH: TSH: 2.352 u[IU]/mL (ref 0.350–4.500)

## 2015-03-28 LAB — VITAMIN D 25 HYDROXY (VIT D DEFICIENCY, FRACTURES): VIT D 25 HYDROXY: 22 ng/mL — AB (ref 30–100)

## 2015-03-30 ENCOUNTER — Ambulatory Visit (INDEPENDENT_AMBULATORY_CARE_PROVIDER_SITE_OTHER): Payer: BC Managed Care – PPO | Admitting: Internal Medicine

## 2015-03-30 ENCOUNTER — Other Ambulatory Visit (HOSPITAL_COMMUNITY)
Admission: RE | Admit: 2015-03-30 | Discharge: 2015-03-30 | Disposition: A | Discharge status: Home / Self Care | Payer: BC Managed Care – PPO | Source: Ambulatory Visit | Attending: Internal Medicine | Admitting: Internal Medicine

## 2015-03-30 ENCOUNTER — Encounter: Payer: Self-pay | Admitting: Internal Medicine

## 2015-03-30 VITALS — BP 124/70 | HR 74 | Temp 97.7°F | Resp 20 | Ht 71.0 in | Wt 209.0 lb

## 2015-03-30 DIAGNOSIS — R718 Other abnormality of red blood cells: Secondary | ICD-10-CM | POA: Diagnosis not present

## 2015-03-30 DIAGNOSIS — Z0000 Encounter for general adult medical examination without abnormal findings: Secondary | ICD-10-CM

## 2015-03-30 DIAGNOSIS — Z1272 Encounter for screening for malignant neoplasm of vagina: Secondary | ICD-10-CM | POA: Diagnosis not present

## 2015-03-30 DIAGNOSIS — E559 Vitamin D deficiency, unspecified: Secondary | ICD-10-CM

## 2015-03-30 DIAGNOSIS — Z01419 Encounter for gynecological examination (general) (routine) without abnormal findings: Secondary | ICD-10-CM | POA: Diagnosis present

## 2015-03-30 DIAGNOSIS — D563 Thalassemia minor: Secondary | ICD-10-CM

## 2015-04-01 LAB — CYTOLOGY - PAP

## 2015-04-14 ENCOUNTER — Ambulatory Visit: Payer: BC Managed Care – PPO | Admitting: *Deleted

## 2015-04-14 DIAGNOSIS — M779 Enthesopathy, unspecified: Secondary | ICD-10-CM

## 2015-04-14 NOTE — Progress Notes (Signed)
Patient ID: Laura Meyers, female   DOB: 16-Nov-1955, 60 y.o.   MRN: [id number] Patient presents for orthotic pick up.  Verbal and written break in and wear instructions given.  Patient will follow up in 4 weeks if symptoms worsen or fail to improve.

## 2015-04-14 NOTE — Patient Instructions (Signed)

## 2015-04-19 ENCOUNTER — Encounter: Payer: Self-pay | Admitting: Internal Medicine

## 2015-04-19 NOTE — Patient Instructions (Signed)
It was a pleasure to see you today. Take 2000 units vitamin D 3 daily. Please have annual mammogram. Return in one year.

## 2015-04-19 NOTE — Progress Notes (Signed)
   Subjective:    Patient ID: Laura Meyers, female    DOB: [birth year], 60 y.o.   MRN: [id number]  HPI Patient has not been here since 2013 at which time she had physical examination. She says in the interim her general health has been excellent. No new complaints. In 2013 she had a history of vitamin D deficiency, nocturnal leg cramps, fibrocystic breast disease, functional constipation.  She had colonoscopy 03/25/2011. She declines flu vaccine. Tetanus immunization given January 2011.  No known drug allergies  Social history: She is divorced. No children. Does not smoke or consume alcohol. 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  History of hysterectomy without oophorectomy for fibroids 2002. Benign right breast biopsy 2002. History of surgery for left hammertoe. 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 Noemi Chapel July 1995.  Family history: Mother died of breast cancer in her 40s with history of hypertension. Sister with hypertension. Father died of COPD in his 94s. Total of 2 brothers and 2 sisters.   In 2013 she had Pap taken of vaginal cuff. No masses on bimanual exam. A stitch was removed from inside vagina which was likely a remnant of her previous hysterectomy that worked its way out.  Remote history of benign positional vertigo    Review of Systems  Constitutional: Negative.   All other systems reviewed and are negative.       Objective:   Physical Exam  Constitutional: She is oriented to person, place, and time. She appears well-developed and well-nourished. No distress.  HENT:  Head: Normocephalic and atraumatic.  Right Ear: External ear normal.  Mouth/Throat: Oropharynx is clear and moist. No oropharyngeal exudate.  Eyes: Conjunctivae and EOM are normal. Pupils are  equal, round, and reactive to light. Right eye exhibits no discharge. Left eye exhibits no discharge. No scleral icterus.  Neck: Neck supple. No JVD present. No thyromegaly present.  Cardiovascular: Normal rate, regular rhythm, normal heart sounds and intact distal pulses.   No murmur heard. Pulmonary/Chest: Effort normal and breath sounds normal. No respiratory distress. She has no wheezes. She has no rales.  Breasts normal female without masses  Abdominal: Bowel sounds are normal. She exhibits no distension and no mass. There is no tenderness. There is no rebound and no guarding.  Genitourinary:  Pap taken of vaginal cuff. Bimanual is normal.  Musculoskeletal: She exhibits no edema.  Lymphadenopathy:    She has no cervical adenopathy.  Neurological: She is alert and oriented to person, place, and time. She has normal reflexes. No cranial nerve deficit.  Skin: Skin is warm and dry. No rash noted. She is not diaphoretic.  Psychiatric: She has a normal mood and affect. Her behavior is normal. Judgment and thought content normal.  Vitals reviewed.         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 Return in one year or as needed. Recommend annual mammogram.

## 2016-02-23 ENCOUNTER — Other Ambulatory Visit: Payer: Self-pay | Admitting: Internal Medicine

## 2016-02-23 DIAGNOSIS — Z1231 Encounter for screening mammogram for malignant neoplasm of breast: Secondary | ICD-10-CM

## 2016-04-01 ENCOUNTER — Ambulatory Visit
Admission: RE | Admit: 2016-04-01 | Discharge: 2016-04-01 | Disposition: A | Discharge status: Home / Self Care | Payer: BC Managed Care – PPO | Source: Ambulatory Visit | Attending: Internal Medicine | Admitting: Internal Medicine

## 2016-04-01 DIAGNOSIS — Z1231 Encounter for screening mammogram for malignant neoplasm of breast: Secondary | ICD-10-CM

## 2016-04-18 ENCOUNTER — Encounter: Payer: Self-pay | Admitting: Internal Medicine

## 2016-04-18 ENCOUNTER — Ambulatory Visit (INDEPENDENT_AMBULATORY_CARE_PROVIDER_SITE_OTHER): Payer: BC Managed Care – PPO | Admitting: Internal Medicine

## 2016-04-18 VITALS — BP 110/60 | HR 60 | Temp 98.7°F | Wt 187.0 lb

## 2016-04-18 DIAGNOSIS — Z202 Contact with and (suspected) exposure to infections with a predominantly sexual mode of transmission: Secondary | ICD-10-CM

## 2016-04-18 DIAGNOSIS — N898 Other specified noninflammatory disorders of vagina: Secondary | ICD-10-CM | POA: Diagnosis not present

## 2016-04-18 LAB — POC URINALSYSI DIPSTICK (AUTOMATED)
BILIRUBIN UA: NEGATIVE
Glucose, UA: NEGATIVE
KETONES UA: NEGATIVE
Leukocytes, UA: NEGATIVE
Nitrite, UA: NEGATIVE
PH UA: 7
PROTEIN UA: NEGATIVE
SPEC GRAV UA: 1.015
Urobilinogen, UA: NEGATIVE

## 2016-04-18 NOTE — Progress Notes (Signed)
   Subjective:    Patient ID: Laura Meyers, female    DOB: [birth year], 61 y.o.   MRN: [id number]  HPI One episode of unprotected intercourse recently. Prior to this had not been sexually active in several years. Now has vaginal discharge and has noticed some spotting. Has been menopausal for a number of years. No lesions consistent with Herpes simplex.No pain or dysuria. Paranoid discharge was a bit thick and whitish without significant odor. No itching.    Review of Systems     Objective:   Physical Exam Cervix is friable. GC and chlamydia probes taken. Unable to do wet prep due to blood in and around cervix. There does seem to be some cream-colored vaginal discharge me next in with a blood       Assessment & Plan:  Possible STD  Possible vaginitis  Plan: GC and chlamydia probes pending. Await results.

## 2016-04-18 NOTE — Patient Instructions (Addendum)
Await GC and chlamydia probes before proceeding with treatment.

## 2016-04-19 ENCOUNTER — Encounter: Payer: Self-pay | Admitting: Internal Medicine

## 2016-04-19 ENCOUNTER — Telehealth: Payer: Self-pay | Admitting: Internal Medicine

## 2016-04-19 LAB — GC/CHLAMYDIA PROBE AMP
CT Probe RNA: NOT DETECTED
GC Probe RNA: NOT DETECTED

## 2016-04-19 MED ORDER — METRONIDAZOLE 500 MG PO TABS: 500.0000 mg | ORAL_TABLET | Freq: Two times a day (BID) | ORAL | 0 refills | Status: DC

## 2016-04-19 NOTE — Telephone Encounter (Signed)
GC and chlamydia probes are negative. Patient says vaginal discharge has no odor and does not itch. Spotting last today. I'm going to call in for her Flagyl 500 mg twice daily for 7 days. Was really not able to do a good wet prep because of spotting. If spotting continues she is to let me know. She may need to see GYN regarding endometrial biopsy.

## 2016-05-06 ENCOUNTER — Other Ambulatory Visit: Payer: BC Managed Care – PPO | Admitting: Internal Medicine

## 2016-05-06 DIAGNOSIS — Z0000 Encounter for general adult medical examination without abnormal findings: Secondary | ICD-10-CM

## 2016-05-06 DIAGNOSIS — E559 Vitamin D deficiency, unspecified: Secondary | ICD-10-CM

## 2016-05-06 DIAGNOSIS — Z13220 Encounter for screening for lipoid disorders: Secondary | ICD-10-CM

## 2016-05-06 DIAGNOSIS — Z1329 Encounter for screening for other suspected endocrine disorder: Secondary | ICD-10-CM

## 2016-05-06 LAB — COMPREHENSIVE METABOLIC PANEL
ALK PHOS: 72 U/L (ref 33–130)
ALT: 18 U/L (ref 6–29)
AST: 23 U/L (ref 10–35)
Albumin: 3.8 g/dL (ref 3.6–5.1)
BUN: 15 mg/dL (ref 7–25)
CO2: 25 mmol/L (ref 20–31)
CREATININE: 0.81 mg/dL (ref 0.50–1.05)
Calcium: 9.7 mg/dL (ref 8.6–10.4)
Chloride: 104 mmol/L (ref 98–110)
GLUCOSE: 86 mg/dL (ref 65–99)
POTASSIUM: 4.1 mmol/L (ref 3.5–5.3)
SODIUM: 139 mmol/L (ref 135–146)
Total Bilirubin: 0.6 mg/dL (ref 0.2–1.2)
Total Protein: 7 g/dL (ref 6.1–8.1)

## 2016-05-06 LAB — LIPID PANEL
CHOL/HDL RATIO: 2 ratio (ref <5.0)
Cholesterol: 174 mg/dL (ref <200)
HDL: 86 mg/dL (ref >50)
LDL Cholesterol: 79 mg/dL (ref <100)
Triglycerides: 47 mg/dL (ref <150)
VLDL: 9 mg/dL (ref <30)

## 2016-05-06 LAB — CBC WITH DIFFERENTIAL/PLATELET
BASOS PCT: 1 %
Basophils Absolute: 72 cells/uL (ref 0–200)
EOS PCT: 1 %
Eosinophils Absolute: 72 cells/uL (ref 15–500)
HCT: 38.3 % (ref 35.0–45.0)
Hemoglobin: 12.1 g/dL (ref 11.7–15.5)
LYMPHS PCT: 17 %
Lymphs Abs: 1224 cells/uL (ref 850–3900)
MCH: 24.2 pg — ABNORMAL LOW (ref 27.0–33.0)
MCHC: 31.6 g/dL — ABNORMAL LOW (ref 32.0–36.0)
MCV: 76.8 fL — AB (ref 80.0–100.0)
MONOS PCT: 4 %
MPV: 10.2 fL (ref 7.5–12.5)
Monocytes Absolute: 288 cells/uL (ref 200–950)
NEUTROS ABS: 5544 {cells}/uL (ref 1500–7800)
Neutrophils Relative %: 77 %
PLATELETS: 215 10*3/uL (ref 140–400)
RBC: 4.99 MIL/uL (ref 3.80–5.10)
RDW: 16.3 % — AB (ref 11.0–15.0)
WBC: 7.2 10*3/uL (ref 3.8–10.8)

## 2016-05-06 LAB — TSH: TSH: 1.36 m[IU]/L

## 2016-05-07 LAB — VITAMIN D 25 HYDROXY (VIT D DEFICIENCY, FRACTURES): VIT D 25 HYDROXY: 26 ng/mL — AB (ref 30–100)

## 2016-05-10 ENCOUNTER — Other Ambulatory Visit (HOSPITAL_COMMUNITY)
Admission: RE | Admit: 2016-05-10 | Discharge: 2016-05-10 | Disposition: A | Discharge status: Home / Self Care | Payer: BC Managed Care – PPO | Source: Ambulatory Visit | Attending: Internal Medicine | Admitting: Internal Medicine

## 2016-05-10 ENCOUNTER — Encounter: Payer: Self-pay | Admitting: Internal Medicine

## 2016-05-10 ENCOUNTER — Ambulatory Visit (INDEPENDENT_AMBULATORY_CARE_PROVIDER_SITE_OTHER): Payer: BC Managed Care – PPO | Admitting: Internal Medicine

## 2016-05-10 VITALS — BP 102/70 | HR 69 | Temp 98.4°F | Ht 68.5 in | Wt 184.0 lb

## 2016-05-10 DIAGNOSIS — Z124 Encounter for screening for malignant neoplasm of cervix: Secondary | ICD-10-CM

## 2016-05-10 DIAGNOSIS — Z01419 Encounter for gynecological examination (general) (routine) without abnormal findings: Secondary | ICD-10-CM | POA: Diagnosis not present

## 2016-05-10 DIAGNOSIS — E559 Vitamin D deficiency, unspecified: Secondary | ICD-10-CM

## 2016-05-10 DIAGNOSIS — Z0000 Encounter for general adult medical examination without abnormal findings: Secondary | ICD-10-CM | POA: Diagnosis not present

## 2016-05-10 DIAGNOSIS — F458 Other somatoform disorders: Secondary | ICD-10-CM

## 2016-05-10 DIAGNOSIS — R0989 Other specified symptoms and signs involving the circulatory and respiratory systems: Secondary | ICD-10-CM

## 2016-05-10 LAB — POCT URINALYSIS DIPSTICK
Bilirubin, UA: NEGATIVE
Glucose, UA: NEGATIVE
Ketones, UA: NEGATIVE
LEUKOCYTES UA: NEGATIVE
NITRITE UA: NEGATIVE
PH UA: 7.5
PROTEIN UA: NEGATIVE
RBC UA: NEGATIVE
Spec Grav, UA: 1.02
UROBILINOGEN UA: NEGATIVE

## 2016-05-10 MED ORDER — ALPRAZOLAM 0.25 MG PO TABS: 0.2500 mg | ORAL_TABLET | Freq: Every evening | ORAL | 0 refills | Status: DC | PRN

## 2016-05-10 NOTE — Progress Notes (Signed)
   Subjective:    Patient ID: Laura Meyers, female    DOB: 07/27/1955, 61 y.o.   MRN: [id number]  HPI 61 year old Black Female In today for health maintenance exam and evaluation of medical issues.  History of fibrocystic breast disease and functional constipation.  Recently treated for vaginitis which has resolved. STD testing was negative.  No known drug allergies.  Had colonoscopy 2013. She declines flu vaccine. Tetanus immunization given 2011.  Social history: She is divorced. No children. Does not smoke or consume alcohol. 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 Prior to that was with [employer] [facility] Department.  Family history: Mother died of breast cancer in her 31s with history of hypertension. Sister with hypertension. Father died of COPD in his 90s. Total of 2 brothers and 2 sisters.  Remote history of benign positional vertigo.  History of hysterectomy without oophorectomy for fibroids in 2002. Benign right breast biopsy 2002. History of surgery for left hammertoe. 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 Noemi Chapel July 1995.  Exercises regularly 3 or 4 times a week at gym.    Review of Systems globus sensation from time to time finding it difficult to sleep. Denies being significantly anxious about anything in particular, some nocturnal leg cramps, Vitamin D deficiency discussed.     Objective:   Physical Exam  Constitutional: She is oriented to person, place, and time. She appears well-developed and well-nourished. No distress.  HENT:  Head: Normocephalic and atraumatic.  Right Ear: External ear normal.  Left Ear: External ear normal.  Mouth/Throat: Oropharynx is clear and moist.  Eyes: Conjunctivae and EOM are normal. Pupils are equal, round, and reactive to light. Right eye exhibits no discharge. Left eye  exhibits no discharge. No scleral icterus.  Neck: Neck supple. No JVD present. No thyromegaly present.  Cardiovascular: Normal rate, regular rhythm, normal heart sounds and intact distal pulses.   No murmur heard. Pulmonary/Chest: Effort normal and breath sounds normal. No respiratory distress. She has no wheezes. She has no rales. She exhibits no tenderness.  Breasts normal female without masses  Abdominal: Soft. Bowel sounds are normal. She exhibits no distension and no mass. There is no tenderness. There is no rebound and no guarding.  Genitourinary:  Genitourinary Comments: Pap taken of vaginal cuff.  Bimanual normal.  Musculoskeletal: She exhibits no edema.  Lymphadenopathy:    She has no cervical adenopathy.  Neurological: She is alert and oriented to person, place, and time. She has normal reflexes. No cranial nerve deficit.  Skin: Skin is warm and dry. No rash noted. She is not diaphoretic.  Psychiatric: She has a normal mood and affect. Her behavior is normal. Judgment and thought content normal.  Vitals reviewed.         Assessment & Plan:  Vitamin D deficiency-take 2000 units vitamin D 3 daily  Globus sensation-take Xanax 0.25 mg sparingly at night for sensation. She was reassured.  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

## 2016-05-10 NOTE — Patient Instructions (Addendum)
It was a pleasure to see you today. Takes Xanax sparingly at night for globus sensation. Return in one year or as needed. Take 2000 units vitamin D 3 daily

## 2016-05-12 LAB — CYTOLOGY - PAP: DIAGNOSIS: NEGATIVE

## 2016-07-19 ENCOUNTER — Ambulatory Visit (INDEPENDENT_AMBULATORY_CARE_PROVIDER_SITE_OTHER): Payer: BC Managed Care – PPO | Admitting: Internal Medicine

## 2016-07-19 ENCOUNTER — Encounter: Payer: Self-pay | Admitting: Internal Medicine

## 2016-07-19 VITALS — BP 102/60 | HR 94 | Temp 99.4°F | Wt 187.0 lb

## 2016-07-19 DIAGNOSIS — H65111 Acute and subacute allergic otitis media (mucoid) (sanguinous) (serous), right ear: Secondary | ICD-10-CM

## 2016-07-19 DIAGNOSIS — J069 Acute upper respiratory infection, unspecified: Secondary | ICD-10-CM | POA: Diagnosis not present

## 2016-07-19 MED ORDER — HYDROCODONE-HOMATROPINE 5-1.5 MG/5ML PO SYRP: 5.0000 mL | ORAL_SOLUTION | Freq: Three times a day (TID) | ORAL | 0 refills | Status: DC | PRN

## 2016-07-19 MED ORDER — AZITHROMYCIN 250 MG PO TABS: ORAL_TABLET | ORAL | 0 refills | Status: DC

## 2016-07-19 MED ORDER — FLUCONAZOLE 150 MG PO TABS: 150.0000 mg | ORAL_TABLET | Freq: Once | ORAL | 1 refills | Status: AC

## 2016-07-19 NOTE — Progress Notes (Signed)
   Subjective:    Patient ID: Benjie Karvonen, female    DOB: 1955-11-30, 61 y.o.   MRN: [id number]  HPI  61 year old Female who had onset yesterday of tickle in throat proceeding to a cough that was somewhat deep and she really had trouble controlling the cough particularly last night. Had difficulty sleeping. No shaking chills or myalgias. No sore throat. No ear pain.  Cough is nonproductive but she feels the cough is congested.    Review of Systems see above     Objective:   Physical Exam  Skin warm and dry. Nodes none. Left TM is full. Right TM is clear. Pharynx is injected without exudate. Neck is supple. Chest clear to auscultation without rales or wheezing. She does have a hacking cough in the office today.      Assessment & Plan:  Acute URI  Acute left serous otitis media  Plan: Zithromax Z-PAK take 2 tablets day 1 followed by 1 tablet days 2 through 5. Diflucan 150 mg 1 tablet if Candida vaginitis develops while on antibiotic therapy with 1 refill. Hycodan 1 teaspoon by mouth every 8 hours when necessary cough. If she needs to take something for cough during the day she may take Delsym which will not cause drowsiness. Rest and drink plenty of fluids.

## 2016-07-19 NOTE — Patient Instructions (Addendum)
Zithromax Z-PAK take 2 tablets day 1 followed by 1 tablet days 2 through 5. May call for refill if she so desires. Hycodan 1 teaspoon by mouth every 8 hours when necessary cough. If she needs something that will not cause drowsiness she may take Delsym 1 teaspoon by mouth every 12 hours. Diflucan 150 mg tablet with 1 refill should Candida vaginitis develops while on antibiotics. Rest and drink plenty of fluids. It was a pleasure to see you today.

## 2016-07-22 ENCOUNTER — Telehealth: Payer: Self-pay

## 2016-07-22 MED ORDER — LEVOFLOXACIN 500 MG PO TABS: 500.0000 mg | ORAL_TABLET | Freq: Every day | ORAL | 0 refills | Status: DC

## 2016-07-22 NOTE — Telephone Encounter (Signed)
Call in Levaquin 500 mg daily x 7 days.

## 2016-07-22 NOTE — Telephone Encounter (Signed)
Levaquin was e-scribed. Pt notified.

## 2016-07-22 NOTE — Telephone Encounter (Signed)
Patient called said she was here on 5/01 she was prescribed a zpak and today she will take the last dose of it. She said she still coughing, she said she's not coughing up anything, she ran a fever on Wednesday and the was the only day she had a faver. She said she tried delsym at night and it did not help at all. She would like to know if you can call in something else for her or does she need to come in for a visit?  Symptoms are coughing and sneezing.  Pharmacy [place]

## 2016-08-12 ENCOUNTER — Ambulatory Visit
Admission: RE | Admit: 2016-08-12 | Discharge: 2016-08-12 | Disposition: A | Discharge status: Home / Self Care | Payer: BC Managed Care – PPO | Source: Ambulatory Visit | Attending: Internal Medicine | Admitting: Internal Medicine

## 2016-08-12 ENCOUNTER — Ambulatory Visit (INDEPENDENT_AMBULATORY_CARE_PROVIDER_SITE_OTHER): Payer: BC Managed Care – PPO | Admitting: Internal Medicine

## 2016-08-12 ENCOUNTER — Encounter: Payer: Self-pay | Admitting: Internal Medicine

## 2016-08-12 VITALS — BP 98/60 | HR 78 | Temp 98.6°F | Ht 68.5 in | Wt 184.0 lb

## 2016-08-12 DIAGNOSIS — J411 Mucopurulent chronic bronchitis: Secondary | ICD-10-CM | POA: Diagnosis not present

## 2016-08-12 DIAGNOSIS — R05 Cough: Secondary | ICD-10-CM | POA: Diagnosis not present

## 2016-08-12 DIAGNOSIS — J22 Unspecified acute lower respiratory infection: Secondary | ICD-10-CM | POA: Diagnosis not present

## 2016-08-12 DIAGNOSIS — J069 Acute upper respiratory infection, unspecified: Secondary | ICD-10-CM

## 2016-08-12 DIAGNOSIS — R059 Cough, unspecified: Secondary | ICD-10-CM

## 2016-08-12 MED ORDER — PREDNISONE 10 MG PO TABS: 10.0000 mg | ORAL_TABLET | Freq: Every day | ORAL | 0 refills | Status: DC

## 2016-08-12 MED ORDER — CEFTRIAXONE SODIUM 1 G IJ SOLR
1.0000 g | Freq: Once | INTRAMUSCULAR | Status: AC
  Administered 2016-08-12: 1 g via INTRAMUSCULAR

## 2016-08-12 MED ORDER — PREDNISONE 10 MG PO TABS: ORAL_TABLET | ORAL | 0 refills | Status: DC

## 2016-08-12 MED ORDER — HYDROCODONE-HOMATROPINE 5-1.5 MG/5ML PO SYRP: 5.0000 mL | ORAL_SOLUTION | Freq: Three times a day (TID) | ORAL | 0 refills | Status: DC | PRN

## 2016-08-12 MED ORDER — CLARITHROMYCIN 500 MG PO TABS: 500.0000 mg | ORAL_TABLET | Freq: Two times a day (BID) | ORAL | 0 refills | Status: DC

## 2016-08-12 NOTE — Progress Notes (Signed)
   Subjective:    Patient ID: Laura Meyers, female    DOB: [birth year], 61 y.o.   MRN: [id number]  HPI 61 year old Female who is here with recurrent respiratory infection.  Seen May 1st with URI treated with Hycodan and a Zithromax Z-Pak. She called back on May 4 complaining of fever and still coughing. She tried Delsym and it did not help. Still had coughing and sneezing. Called in Levaquin for 7 days 500 mg daily.  She was doing well until earlier this week when she began to develop cough. One night she had a night sweat. She thinks she had a fever but did not take her temperature. Now bringing up green sputum. Has run out of cough syrup. No wheezing or respiratory distress. Says her throat is been a bit sore. She sounds a bit hoarse when she speaks.    Review of Systems see above     Objective:   Physical Exam Skin warm and dry. Nodes none. TMs are clear. Pharynx is slightly injected without exudate. Neck is supple. Chest clear to auscultation. Chest x-ray shows no infiltrate       Assessment & Plan:  Acute bronchitis  ? Allergic rhinitis  Plan: Rocephin 1 g IM. Sterapred DS 10 mg 6 day dosepak. Refill Hycodan 1 teaspoon by mouth every 8-12 hours when necessary cough. Biaxin 500 mg twice daily with a meal 10 days.  If patient is not improving on this regimen, refer to allergist.

## 2016-08-14 NOTE — Patient Instructions (Addendum)
Rocephin 1 g IM. Sterapred DS 10 mg 6 day dosepak to take his directed. Biaxin 500 mg twice daily for 10 days. Hycodan refilled. If not improving, refer to allergist.

## 2017-02-28 ENCOUNTER — Other Ambulatory Visit: Payer: Self-pay | Admitting: Internal Medicine

## 2017-02-28 DIAGNOSIS — Z1231 Encounter for screening mammogram for malignant neoplasm of breast: Secondary | ICD-10-CM

## 2017-03-06 ENCOUNTER — Ambulatory Visit: Payer: BC Managed Care – PPO | Admitting: Internal Medicine

## 2017-03-06 ENCOUNTER — Encounter: Payer: Self-pay | Admitting: Internal Medicine

## 2017-03-06 ENCOUNTER — Other Ambulatory Visit: Payer: Self-pay | Admitting: Internal Medicine

## 2017-03-06 VITALS — BP 100/60 | HR 79 | Temp 97.9°F | Ht 68.5 in | Wt 193.0 lb

## 2017-03-06 DIAGNOSIS — N6011 Diffuse cystic mastopathy of right breast: Secondary | ICD-10-CM

## 2017-03-06 DIAGNOSIS — N644 Mastodynia: Secondary | ICD-10-CM

## 2017-03-06 NOTE — Progress Notes (Signed)
   Subjective:    Patient ID: Laura Meyers, female    DOB: June 12, 1955, 61 y.o.   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  Was working out in the gym recently and noticed some discomfort in her right breast.  Seem to be in the lateral part of her upper right breast initially and then moved more centrally to the right of the nipple.  She was lifting weights for exercise.  Has seem to be improved over the last several days and she.  However she has been concerned.  When she called the breast center and they indicated it was not time for her regular mammogram if she could be seen and ordered through this office.    Review of Systems see above     Objective:   Physical Exam Both breasts have fibrocystic changes but the left has less than the right breast.  She has no nipple discharges.  I do not palpate a discrete nodule in the right breast is just some thickening consistent with fibrocystic breast disease.  No tender areas in right breast today.       Assessment & Plan:  Fibrocystic breast disease with recent onset of pain in right breast  Plan: She will be going Friday for a diagnostic mammogram of the right breast.

## 2017-03-06 NOTE — Patient Instructions (Signed)
To have diagnostic mammogram of the right breast this coming Friday

## 2017-03-10 ENCOUNTER — Ambulatory Visit
Admission: RE | Admit: 2017-03-10 | Discharge: 2017-03-10 | Disposition: A | Discharge status: Home / Self Care | Payer: BC Managed Care – PPO | Source: Ambulatory Visit | Attending: Internal Medicine | Admitting: Internal Medicine

## 2017-03-10 ENCOUNTER — Ambulatory Visit: Payer: BC Managed Care – PPO

## 2017-03-10 DIAGNOSIS — N644 Mastodynia: Secondary | ICD-10-CM

## 2017-03-23 ENCOUNTER — Other Ambulatory Visit: Payer: Self-pay | Admitting: Internal Medicine

## 2017-03-23 DIAGNOSIS — Z139 Encounter for screening, unspecified: Secondary | ICD-10-CM

## 2017-04-12 ENCOUNTER — Ambulatory Visit
Admission: RE | Admit: 2017-04-12 | Discharge: 2017-04-12 | Disposition: A | Discharge status: Home / Self Care | Payer: BC Managed Care – PPO | Source: Ambulatory Visit | Attending: Internal Medicine | Admitting: Internal Medicine

## 2017-04-12 DIAGNOSIS — Z139 Encounter for screening, unspecified: Secondary | ICD-10-CM

## 2018-03-05 ENCOUNTER — Other Ambulatory Visit: Payer: Self-pay | Admitting: Internal Medicine

## 2018-03-05 DIAGNOSIS — Z1231 Encounter for screening mammogram for malignant neoplasm of breast: Secondary | ICD-10-CM

## 2018-04-13 ENCOUNTER — Ambulatory Visit
Admission: RE | Admit: 2018-04-13 | Discharge: 2018-04-13 | Disposition: A | Discharge status: Home / Self Care | Payer: BC Managed Care – PPO | Source: Ambulatory Visit | Attending: Internal Medicine | Admitting: Internal Medicine

## 2018-04-13 DIAGNOSIS — Z1231 Encounter for screening mammogram for malignant neoplasm of breast: Secondary | ICD-10-CM

## 2018-12-26 IMAGING — CR DG CHEST 2V
2 series · 2 of 2 positions shown · non-contrast
Comparison: None.

CLINICAL DATA: Productive cough

EXAM:
CHEST  2 VIEW

[w chest pa]
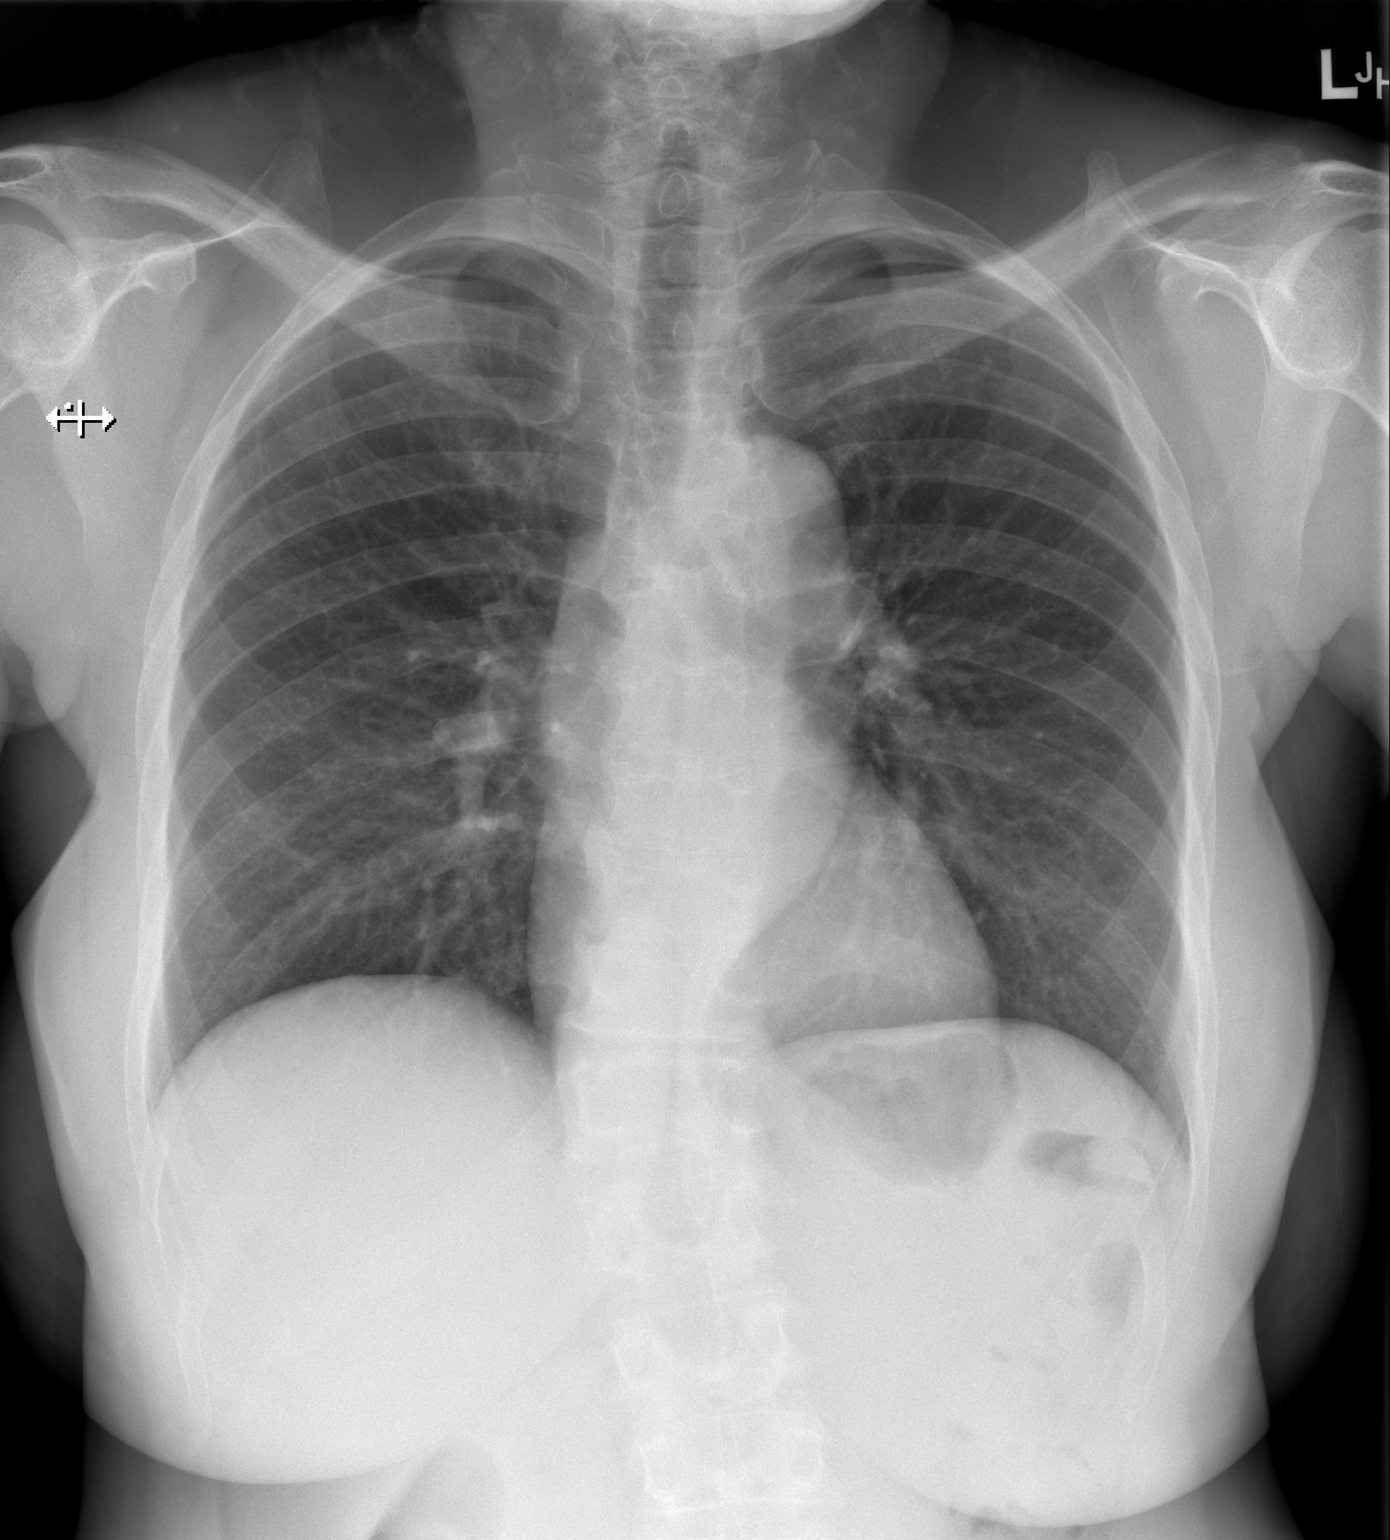

[w chest lat]
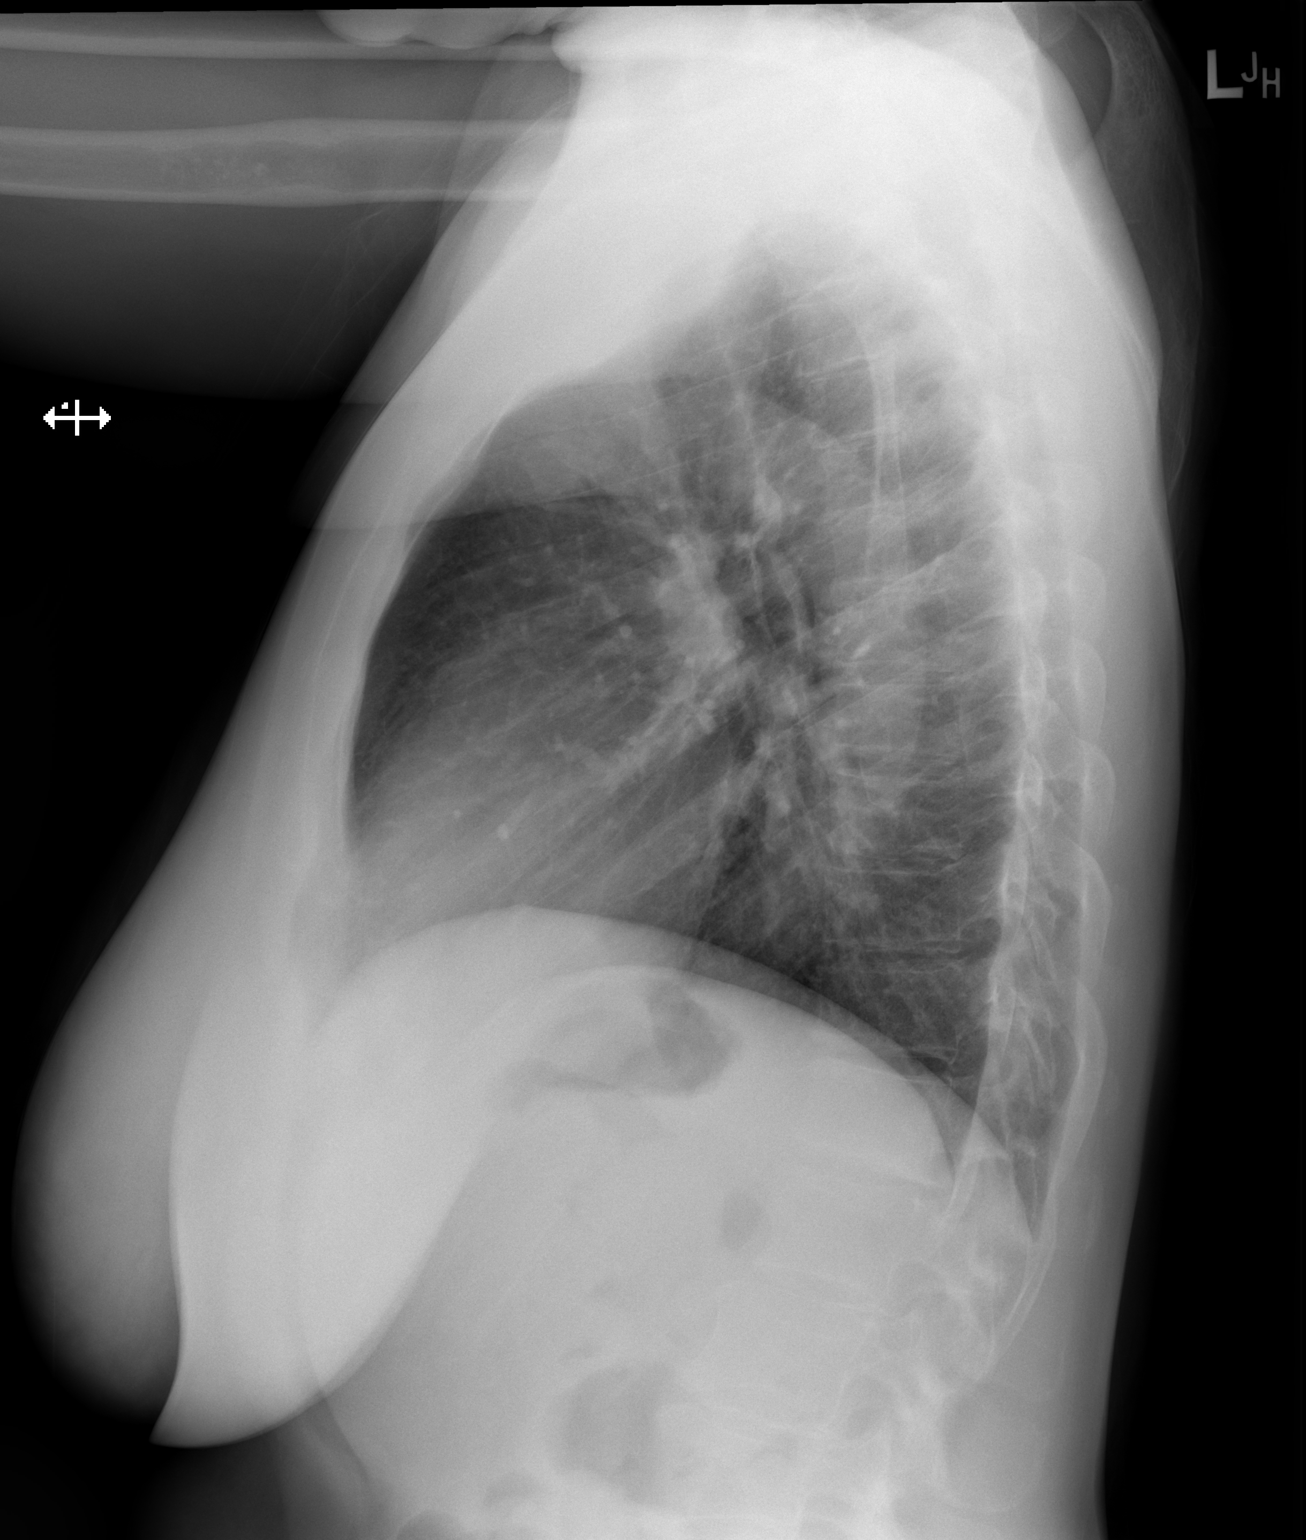

[2 of 2 positions shown; findings below may reference images not displayed]

FINDINGS: Heart and mediastinal contours are within normal limits. No focal
opacities or effusions. No acute bony abnormality. Mild tortuosity
of the thoracic aorta.
IMPRESSION: No active cardiopulmonary disease.

## 2019-01-11 ENCOUNTER — Telehealth: Payer: Self-pay | Admitting: Internal Medicine

## 2019-01-11 NOTE — Telephone Encounter (Signed)
Virtual visit 

## 2019-01-11 NOTE — Telephone Encounter (Signed)
OK 

## 2019-01-11 NOTE — Telephone Encounter (Signed)
[facility] just called back to say that the person with COVID was there on Monday so she was not exposed at all.

## 2019-01-11 NOTE — Telephone Encounter (Signed)
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 So she would like to go be tested, no symptoms.

## 2019-03-11 ENCOUNTER — Other Ambulatory Visit: Payer: Self-pay | Admitting: Internal Medicine

## 2019-03-11 DIAGNOSIS — Z1231 Encounter for screening mammogram for malignant neoplasm of breast: Secondary | ICD-10-CM

## 2019-04-25 ENCOUNTER — Ambulatory Visit
Admission: RE | Admit: 2019-04-25 | Discharge: 2019-04-25 | Disposition: A | Discharge status: Home / Self Care | Payer: BC Managed Care – PPO | Source: Ambulatory Visit | Attending: Internal Medicine | Admitting: Internal Medicine

## 2019-04-25 ENCOUNTER — Other Ambulatory Visit: Payer: Self-pay

## 2019-04-25 DIAGNOSIS — Z1231 Encounter for screening mammogram for malignant neoplasm of breast: Secondary | ICD-10-CM

## 2019-06-18 ENCOUNTER — Encounter: Payer: Self-pay | Admitting: Internal Medicine

## 2019-06-20 ENCOUNTER — Other Ambulatory Visit: Payer: BC Managed Care – PPO | Admitting: Internal Medicine

## 2019-06-20 ENCOUNTER — Other Ambulatory Visit: Payer: Self-pay

## 2019-06-20 DIAGNOSIS — Z13220 Encounter for screening for lipoid disorders: Secondary | ICD-10-CM

## 2019-06-20 DIAGNOSIS — E559 Vitamin D deficiency, unspecified: Secondary | ICD-10-CM

## 2019-06-20 DIAGNOSIS — Z1329 Encounter for screening for other suspected endocrine disorder: Secondary | ICD-10-CM

## 2019-06-20 DIAGNOSIS — N6019 Diffuse cystic mastopathy of unspecified breast: Secondary | ICD-10-CM

## 2019-06-20 DIAGNOSIS — D563 Thalassemia minor: Secondary | ICD-10-CM

## 2019-06-20 DIAGNOSIS — Z0000 Encounter for general adult medical examination without abnormal findings: Secondary | ICD-10-CM

## 2019-06-21 LAB — CBC WITH DIFFERENTIAL/PLATELET
Absolute Monocytes: 282 cells/uL (ref 200–950)
Basophils Absolute: 40 cells/uL (ref 0–200)
Basophils Relative: 0.9 %
Eosinophils Absolute: 48 cells/uL (ref 15–500)
Eosinophils Relative: 1.1 %
HCT: 42.4 % (ref 35.0–45.0)
Hemoglobin: 13 g/dL (ref 11.7–15.5)
Lymphs Abs: 1074 cells/uL (ref 850–3900)
MCH: 24.1 pg — ABNORMAL LOW (ref 27.0–33.0)
MCHC: 30.7 g/dL — ABNORMAL LOW (ref 32.0–36.0)
MCV: 78.5 fL — ABNORMAL LOW (ref 80.0–100.0)
MPV: 10.4 fL (ref 7.5–12.5)
Monocytes Relative: 6.4 %
Neutro Abs: 2957 cells/uL (ref 1500–7800)
Neutrophils Relative %: 67.2 %
Platelets: 232 10*3/uL (ref 140–400)
RBC: 5.4 10*6/uL — ABNORMAL HIGH (ref 3.80–5.10)
RDW: 14.5 % (ref 11.0–15.0)
Total Lymphocyte: 24.4 %
WBC: 4.4 10*3/uL (ref 3.8–10.8)

## 2019-06-21 LAB — LIPID PANEL
Cholesterol: 192 mg/dL (ref <200)
HDL: 88 mg/dL (ref >=50)
LDL Cholesterol (Calc): 89 mg/dL (calc)
Non-HDL Cholesterol (Calc): 104 mg/dL (calc) (ref <130)
Total CHOL/HDL Ratio: 2.2 (calc) (ref <5.0)
Triglycerides: 61 mg/dL (ref <150)

## 2019-06-21 LAB — COMPLETE METABOLIC PANEL WITH GFR
AG Ratio: 1.2 (calc) (ref 1.0–2.5)
ALT: 21 U/L (ref 6–29)
AST: 24 U/L (ref 10–35)
Albumin: 3.8 g/dL (ref 3.6–5.1)
Alkaline phosphatase (APISO): 73 U/L (ref 37–153)
BUN: 16 mg/dL (ref 7–25)
CO2: 27 mmol/L (ref 20–32)
Calcium: 9.7 mg/dL (ref 8.6–10.4)
Chloride: 102 mmol/L (ref 98–110)
Creat: 0.82 mg/dL (ref 0.50–0.99)
GFR, Est African American: 90 mL/min/{1.73_m2} (ref >=60)
GFR, Est Non African American: 77 mL/min/{1.73_m2} (ref >=60)
Globulin: 3.1 g/dL (calc) (ref 1.9–3.7)
Glucose, Bld: 92 mg/dL (ref 65–99)
Potassium: 4.5 mmol/L (ref 3.5–5.3)
Sodium: 139 mmol/L (ref 135–146)
Total Bilirubin: 0.5 mg/dL (ref 0.2–1.2)
Total Protein: 6.9 g/dL (ref 6.1–8.1)

## 2019-06-21 LAB — VITAMIN D 25 HYDROXY (VIT D DEFICIENCY, FRACTURES): Vit D, 25-Hydroxy: 20 ng/mL — ABNORMAL LOW (ref 30–100)

## 2019-06-21 LAB — TSH: TSH: 2.88 mIU/L (ref 0.40–4.50)

## 2019-06-28 ENCOUNTER — Other Ambulatory Visit (HOSPITAL_COMMUNITY)
Admission: RE | Admit: 2019-06-28 | Discharge: 2019-06-28 | Disposition: A | Discharge status: Home / Self Care | Payer: BC Managed Care – PPO | Source: Ambulatory Visit | Attending: Internal Medicine | Admitting: Internal Medicine

## 2019-06-28 ENCOUNTER — Encounter: Payer: Self-pay | Admitting: Internal Medicine

## 2019-06-28 ENCOUNTER — Other Ambulatory Visit: Payer: Self-pay

## 2019-06-28 ENCOUNTER — Ambulatory Visit: Payer: BC Managed Care – PPO | Admitting: Internal Medicine

## 2019-06-28 VITALS — BP 110/70 | HR 65 | Temp 98.7°F | Ht 69.0 in | Wt 158.0 lb

## 2019-06-28 DIAGNOSIS — E559 Vitamin D deficiency, unspecified: Secondary | ICD-10-CM

## 2019-06-28 DIAGNOSIS — Z0000 Encounter for general adult medical examination without abnormal findings: Secondary | ICD-10-CM | POA: Diagnosis present

## 2019-06-28 LAB — POCT URINALYSIS DIPSTICK
Appearance: NEGATIVE
Bilirubin, UA: NEGATIVE
Blood, UA: NEGATIVE
Glucose, UA: NEGATIVE
Ketones, UA: NEGATIVE
Leukocytes, UA: NEGATIVE
Nitrite, UA: NEGATIVE
Odor: NEGATIVE
Protein, UA: NEGATIVE
Spec Grav, UA: 1.01 (ref 1.010–1.025)
Urobilinogen, UA: 0.2 E.U./dL
pH, UA: 6.5 (ref 5.0–8.0)

## 2019-06-28 MED ORDER — VITAMIN D (ERGOCALCIFEROL) 1.25 MG (50000 UNIT) PO CAPS: 50000.0000 [IU] | ORAL_CAPSULE | ORAL | 3 refills | Status: DC

## 2019-06-28 NOTE — Progress Notes (Signed)
   Subjective:    Patient ID: Laura Meyers, female    DOB: [birth year], 64 y.o.   MRN: [id number]  HPI 64 year old Female in today for health maintenance exam and evaluation of medical issues.  General health is excellent.  No known drug allergies.  History of fibrocystic breast disease and functional constipation.  Had colonoscopy in 2013.  Tetanus immunization given in 2011.  Had first Moderna vaccine in March 2021.  Last had Tdap in January 2011 but this was not repeated today.  Social history: Divorced.  No children.  Does not smoke or consume alcohol.  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   Prior to that position,  worked in the [employer].  Family history: Mother died of breast cancer in her 64s with history of hypertension.  Sister with history of hypertension.  Father died of COPD in his 47s.  Total of 2 brothers and 2 sisters.  Remote history of benign positional vertigo.  History of hysterectomy without oophorectomy for fibroids in 2002.  Benign right breast biopsy 2002.  History of surgery for left hammertoe.  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 Para March July 1995.  Exercises regularly and stays in great shape.  Had colonoscopy in 2013 with 10-year follow-up recommended.  Had mammogram February 2021 which was normal.  Review of Systems  Constitutional: Negative.   All other systems reviewed and are negative.      Objective:   Physical Exam Blood pressure 110/70 pulse 65 temperature 98.7 degrees orally pulse oximetry 97% weight 158 pounds BMI 23.33  Skin warm and dry.  Nodes none.  TMs are clear.  Neck is supple without JVD ,thyromegaly, or carotid bruits.  Chest: Clear to auscultation.  Cardiac exam: Regular rate and rhythm normal S1 and S2 without murmurs or gallops.  Breast without  masses.  Abdomen: Soft nondistended without hepatosplenomegaly masses or tenderness.  Pap taken of vaginal cuff.  Neuro: no focal deficits.          Assessment & Plan:  Vitamin D deficiency-level of 20.  Prescribe Drisdol 50,000 units weekly for 1 year.  Microcytosis-longstanding consistent with thalassemia trait  Other labs including lipid panel and TSH are entirely within normal limits as well as c-Met  Plan: Return in 1 year or as needed.

## 2019-07-01 LAB — CYTOLOGY - PAP: Diagnosis: NEGATIVE

## 2019-07-15 NOTE — Patient Instructions (Signed)
Please take Drisdol 50,000 units weekly for 1 year for vitamin D deficiency.  It was a pleasure to see you today.  Return in 1 year or as needed.

## 2020-03-04 ENCOUNTER — Other Ambulatory Visit: Payer: Self-pay | Admitting: Internal Medicine

## 2020-06-11 ENCOUNTER — Other Ambulatory Visit: Payer: Self-pay | Admitting: Internal Medicine

## 2020-06-11 DIAGNOSIS — Z1231 Encounter for screening mammogram for malignant neoplasm of breast: Secondary | ICD-10-CM

## 2020-07-24 ENCOUNTER — Other Ambulatory Visit: Payer: BC Managed Care – PPO | Admitting: Internal Medicine

## 2020-07-24 ENCOUNTER — Other Ambulatory Visit: Payer: Self-pay

## 2020-07-24 DIAGNOSIS — E559 Vitamin D deficiency, unspecified: Secondary | ICD-10-CM

## 2020-07-24 DIAGNOSIS — Z1329 Encounter for screening for other suspected endocrine disorder: Secondary | ICD-10-CM

## 2020-07-24 DIAGNOSIS — Z0000 Encounter for general adult medical examination without abnormal findings: Secondary | ICD-10-CM

## 2020-07-24 DIAGNOSIS — N6011 Diffuse cystic mastopathy of right breast: Secondary | ICD-10-CM

## 2020-07-25 LAB — CBC WITH DIFFERENTIAL/PLATELET
Absolute Monocytes: 329 cells/uL (ref 200–950)
Basophils Absolute: 50 cells/uL (ref 0–200)
Basophils Relative: 1.1 %
Eosinophils Absolute: 68 cells/uL (ref 15–500)
Eosinophils Relative: 1.5 %
HCT: 39.3 % (ref 35.0–45.0)
Hemoglobin: 12.4 g/dL (ref 11.7–15.5)
Lymphs Abs: 1067 cells/uL (ref 850–3900)
MCH: 24.4 pg — ABNORMAL LOW (ref 27.0–33.0)
MCHC: 31.6 g/dL — ABNORMAL LOW (ref 32.0–36.0)
MCV: 77.2 fL — ABNORMAL LOW (ref 80.0–100.0)
MPV: 11.3 fL (ref 7.5–12.5)
Monocytes Relative: 7.3 %
Neutro Abs: 2988 cells/uL (ref 1500–7800)
Neutrophils Relative %: 66.4 %
Platelets: 194 10*3/uL (ref 140–400)
RBC: 5.09 10*6/uL (ref 3.80–5.10)
RDW: 14.6 % (ref 11.0–15.0)
Total Lymphocyte: 23.7 %
WBC: 4.5 10*3/uL (ref 3.8–10.8)

## 2020-07-25 LAB — LIPID PANEL
Cholesterol: 176 mg/dL (ref <200)
HDL: 80 mg/dL (ref >=50)
LDL Cholesterol (Calc): 83 mg/dL (calc)
Non-HDL Cholesterol (Calc): 96 mg/dL (calc) (ref <130)
Total CHOL/HDL Ratio: 2.2 (calc) (ref <5.0)
Triglycerides: 51 mg/dL (ref <150)

## 2020-07-25 LAB — COMPLETE METABOLIC PANEL WITH GFR
AG Ratio: 1.4 (calc) (ref 1.0–2.5)
ALT: 18 U/L (ref 6–29)
AST: 22 U/L (ref 10–35)
Albumin: 4 g/dL (ref 3.6–5.1)
Alkaline phosphatase (APISO): 83 U/L (ref 37–153)
BUN: 15 mg/dL (ref 7–25)
CO2: 28 mmol/L (ref 20–32)
Calcium: 9.7 mg/dL (ref 8.6–10.4)
Chloride: 103 mmol/L (ref 98–110)
Creat: 0.75 mg/dL (ref 0.50–0.99)
GFR, Est African American: 99 mL/min/{1.73_m2} (ref >=60)
GFR, Est Non African American: 85 mL/min/{1.73_m2} (ref >=60)
Globulin: 2.8 g/dL (calc) (ref 1.9–3.7)
Glucose, Bld: 86 mg/dL (ref 65–99)
Potassium: 4 mmol/L (ref 3.5–5.3)
Sodium: 138 mmol/L (ref 135–146)
Total Bilirubin: 0.8 mg/dL (ref 0.2–1.2)
Total Protein: 6.8 g/dL (ref 6.1–8.1)

## 2020-07-25 LAB — TSH: TSH: 2 mIU/L (ref 0.40–4.50)

## 2020-07-25 LAB — VITAMIN D 25 HYDROXY (VIT D DEFICIENCY, FRACTURES): Vit D, 25-Hydroxy: 65 ng/mL (ref 30–100)

## 2020-07-31 ENCOUNTER — Encounter: Payer: Self-pay | Admitting: Internal Medicine

## 2020-07-31 ENCOUNTER — Other Ambulatory Visit: Payer: Self-pay

## 2020-07-31 ENCOUNTER — Ambulatory Visit (INDEPENDENT_AMBULATORY_CARE_PROVIDER_SITE_OTHER): Payer: BC Managed Care – PPO | Admitting: Internal Medicine

## 2020-07-31 VITALS — BP 102/80 | HR 72 | Ht 69.0 in | Wt 166.0 lb

## 2020-07-31 DIAGNOSIS — D563 Thalassemia minor: Secondary | ICD-10-CM | POA: Diagnosis not present

## 2020-07-31 DIAGNOSIS — Z23 Encounter for immunization: Secondary | ICD-10-CM

## 2020-07-31 DIAGNOSIS — Z0000 Encounter for general adult medical examination without abnormal findings: Secondary | ICD-10-CM

## 2020-07-31 DIAGNOSIS — Z8639 Personal history of other endocrine, nutritional and metabolic disease: Secondary | ICD-10-CM | POA: Diagnosis not present

## 2020-07-31 LAB — POCT URINALYSIS DIPSTICK
Appearance: NEGATIVE
Bilirubin, UA: NEGATIVE
Blood, UA: NEGATIVE
Glucose, UA: NEGATIVE
Ketones, UA: NEGATIVE
Leukocytes, UA: NEGATIVE
Nitrite, UA: NEGATIVE
Odor: NEGATIVE
Protein, UA: NEGATIVE
Spec Grav, UA: 1.01 (ref 1.010–1.025)
Urobilinogen, UA: 0.2 E.U./dL
pH, UA: 6.5 (ref 5.0–8.0)

## 2020-07-31 MED ORDER — VITAMIN D (ERGOCALCIFEROL) 1.25 MG (50000 UNIT) PO CAPS: 1.0000 | ORAL_CAPSULE | ORAL | 3 refills | Status: DC

## 2020-07-31 NOTE — Progress Notes (Signed)
Subjective:    Patient ID: Laura Meyers, female    DOB: [birth year], 65 y.o.   MRN: [id number]  HPI 65 year old Female in today for health maintenance exam and evaluation of medical issues.  Her general health is excellent.  She has no known drug allergies.  History of fibrocystic breast disease and functional constipation.  Had colonoscopy in 2013 with 10-year follow-up recommended.  Tetanus immunization update given today.  She has had 3 Moderna COVID-19 vaccines.  Social history: She is divorced.  No children.  Does not smoke or consume alcohol.  She retired from the state of [place].  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  She is now working in [employer].  Remote history of benign positional vertigo.  History of hysterectomy without oophorectomy for fibroids in 2002.  Benign right breast biopsy 2002.  History of surgery for left hammertoe.  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 Noemi Chapel in July 1995.  Exercises regularly and stays in great shape.  Family history: Mother died of breast cancer in her 49s with history of hypertension.  Sister with history of hypertension.  Father died of COPD in his 62s.  Total of 2 brothers and 2 sisters.    Review of Systems  Constitutional: Negative.   Respiratory: Negative.    Cardiovascular: Negative.   Gastrointestinal: Negative.   Genitourinary: Negative.   Neurological: Negative.   Psychiatric/Behavioral: Negative.        Objective:   Physical Exam Vitals reviewed.  Constitutional:      Appearance: Normal appearance.  HENT:     Head: Normocephalic and atraumatic.     Right Ear: Tympanic membrane and ear canal normal.     Left Ear: Tympanic membrane and ear canal normal.      Nose: Nose normal.     Mouth/Throat:     Pharynx: Oropharynx is clear.  Eyes:     General: No scleral icterus.       Right eye: No discharge.        Left eye: No discharge.     Extraocular Movements: Extraocular movements intact.     Conjunctiva/sclera: Conjunctivae normal.     Pupils: Pupils are equal, round, and reactive to light.  Neck:     Comments: No thyromegaly  Cardiovascular:     Rate and Rhythm: Normal rate and regular rhythm.     Heart sounds: Normal heart sounds. No murmur heard. Pulmonary:     Effort: No respiratory distress.     Breath sounds: Normal breath sounds. No wheezing or rales.  Abdominal:     Palpations: Abdomen is soft. There is no mass.     Tenderness: There is no abdominal tenderness. There is no guarding or rebound.  Genitourinary:    Comments: Pap taken of vaginal cuff in 2021.  Bimanual normal. Musculoskeletal:     Cervical back: Neck supple. No rigidity.     Right lower leg: No edema.     Left lower leg: No edema.  Lymphadenopathy:     Cervical: No cervical adenopathy.  Skin:    General: Skin is warm and dry.     Findings: No rash.  Neurological:     General: No focal deficit present.     Mental Status: She is  alert and oriented to person, place, and time.     Cranial Nerves: No cranial nerve deficit.     Motor: No weakness.     Coordination: Coordination normal.     Gait: Gait normal.  Psychiatric:        Mood and Affect: Mood normal.        Behavior: Behavior normal.        Thought Content: Thought content normal.        Judgment: Judgment normal.          Assessment & Plan:  Normal health maintenance exam  She had vitamin D deficiency last year with level of 20 and it is now normal at 57.  Continue with high-dose vitamin D weekly  Longstanding history of microcytosis-due to thalassemia trait  Plan: Return in 1 year or as needed.

## 2020-08-05 ENCOUNTER — Other Ambulatory Visit: Payer: Self-pay

## 2020-08-05 ENCOUNTER — Ambulatory Visit
Admission: RE | Admit: 2020-08-05 | Discharge: 2020-08-05 | Disposition: A | Discharge status: Home / Self Care | Payer: BC Managed Care – PPO | Source: Ambulatory Visit | Attending: Internal Medicine | Admitting: Internal Medicine

## 2020-08-05 DIAGNOSIS — Z1231 Encounter for screening mammogram for malignant neoplasm of breast: Secondary | ICD-10-CM

## 2020-09-14 NOTE — Patient Instructions (Addendum)
It is always a pleasure to see you.  Lab work is within normal limits.  Your physical exam is normal.  Return in 1 year or as needed.  Tetanus immunization update given today.  Continue high-dose vitamin D weekly.

## 2020-10-12 ENCOUNTER — Telehealth: Payer: Self-pay | Admitting: Internal Medicine

## 2020-10-12 NOTE — Telephone Encounter (Signed)
Laura Meyers done to 2 COVID test at home, they both were invalid, and out of date, she is going to go and get one that is in date and call first thing in the morning with results.

## 2020-10-12 NOTE — Telephone Encounter (Signed)
Laura Meyers [phone]  Laura Meyers called to say yesterday she started coughing, itchy ear and today she has yellow mucus. Has had 2 vaccines and a booster. Going to do Home COVID test and call back, no exposure that she knows of.

## 2020-10-13 ENCOUNTER — Ambulatory Visit: Payer: BC Managed Care – PPO | Admitting: Internal Medicine

## 2020-10-13 ENCOUNTER — Other Ambulatory Visit: Payer: Self-pay

## 2020-10-13 ENCOUNTER — Telehealth (INDEPENDENT_AMBULATORY_CARE_PROVIDER_SITE_OTHER): Payer: BC Managed Care – PPO | Admitting: Internal Medicine

## 2020-10-13 VITALS — Temp 98.4°F

## 2020-10-13 DIAGNOSIS — U071 COVID-19: Secondary | ICD-10-CM

## 2020-10-13 MED ORDER — BENZONATATE 100 MG PO CAPS: 100.0000 mg | ORAL_CAPSULE | Freq: Three times a day (TID) | ORAL | 0 refills | Status: DC | PRN

## 2020-10-13 MED ORDER — FLUCONAZOLE 150 MG PO TABS: 150.0000 mg | ORAL_TABLET | Freq: Once | ORAL | 0 refills | Status: AC

## 2020-10-13 MED ORDER — AZITHROMYCIN 250 MG PO TABS: ORAL_TABLET | ORAL | 0 refills | Status: AC

## 2020-10-13 NOTE — Telephone Encounter (Signed)
Scheduled Virtual Visit, no blood pressure machine or pulse ox

## 2020-10-13 NOTE — Progress Notes (Signed)
   Subjective:    Patient ID: Laura Meyers, female    DOB: [birth year], 65 y.o.   MRN: [id number]  HPI 65 year old Female seen today via interactive audio and video telecommunications due to the Coronavirus pandemic.  Patient is agreeable to visit in this format today.  She is identified using 2 identifiers as Laura Meyers. Laura Meyers, a longstanding patient in this practice.  Patient is at her home and I am in my office.  Patient called on July 25 saying she had performed 2 COVID tests at home and they gave results described as "invalid ".  Apparently tests were out of date.  Subsequently purchased a Binax test which revealed a positive test on July 26.  Virtual visit advised.  She exercises regularly and stays in great shape.  Remote history of benign positional vertigo.  She does not smoke or consume alcohol.   She has nasal congestion, no sore throat, no fever.  Has a little bit of malaise and fatigue.  Records indicate she has had 3 Moderna vaccines the last one in November 2021  Patient was seen here in May for health maintenance exam.  She has no known drug allergies.  Her general health is excellent.  Review of Systems see above     Objective:  Physical Exam Seen on video camera in no acute distress. Sounds slightly nasally congested.  Has slight cough.  Does not appear to be tachypneic.      Assessment & Plan:  Acute COVID-19 virus infection-positive home test  Plan: Zithromax Z-PAK 2 tablets day 1 followed by 1 tablet days 2 through 5 Tessalon Perles 100 mg 3 times a day if needed for cough.  Rest at home.  Stay well-hydrated.  Walk some to keep lungs aerated.  Call if symptoms worsen.  Quarantine at home for 5 days.

## 2020-10-13 NOTE — Telephone Encounter (Signed)
Laura Meyers has tested Positive for COVID with a Home Test, she has cough, itchy ear, yellow mucus, sounds hoarse. Do you want to do Virtual visit or Car Visit?

## 2020-10-14 ENCOUNTER — Encounter: Payer: Self-pay | Admitting: Internal Medicine

## 2020-10-14 NOTE — Patient Instructions (Addendum)
Zithromax Z-PAK 2 tabs day 1 followed by 1 tab days 2 through 5.  Tessalon Perles 100 mg 3 times a day as needed for cough.  Rest at home.  Stay well-hydrated.  Quarantine for 5 days.  Walk some to keep lungs aerated.  Call if symptoms worsen or do not improve.  Do not feel that medical issues and or current conditions warrant Paxlovid.  She will call if symptoms worsen or do not improve.

## 2020-10-19 ENCOUNTER — Telehealth: Payer: Self-pay | Admitting: Internal Medicine

## 2020-10-19 NOTE — Telephone Encounter (Signed)
[place] results to [facility] [phone], phone [phone] Positive 10/13/2020

## 2020-11-12 ENCOUNTER — Telehealth: Payer: Self-pay

## 2020-11-12 ENCOUNTER — Other Ambulatory Visit: Payer: Self-pay

## 2020-11-12 ENCOUNTER — Encounter: Payer: Self-pay | Admitting: Internal Medicine

## 2020-11-12 ENCOUNTER — Telehealth (INDEPENDENT_AMBULATORY_CARE_PROVIDER_SITE_OTHER): Payer: BC Managed Care – PPO | Admitting: Internal Medicine

## 2020-11-12 DIAGNOSIS — J22 Unspecified acute lower respiratory infection: Secondary | ICD-10-CM

## 2020-11-12 DIAGNOSIS — Z8616 Personal history of COVID-19: Secondary | ICD-10-CM

## 2020-11-12 MED ORDER — FLUCONAZOLE 150 MG PO TABS: 150.0000 mg | ORAL_TABLET | Freq: Every day | ORAL | 1 refills | Status: DC

## 2020-11-12 MED ORDER — AZITHROMYCIN 250 MG PO TABS: ORAL_TABLET | ORAL | 0 refills | Status: AC

## 2020-11-12 NOTE — Telephone Encounter (Signed)
scheduled

## 2020-11-12 NOTE — Telephone Encounter (Signed)
Patient had a cough yesterday and the day before. When she swallows her throat is dried. No sore throat. No fever.  COVID tests are negative.  She would like something prescribed.  She said she had bronchitis before and she doesn't want this to turn into bronchitis.

## 2020-11-14 NOTE — Progress Notes (Signed)
   Subjective:    Patient ID: Laura Meyers, female    DOB: January 15, 1956, 65 y.o.   MRN: [id number]  HPI Pleasant 65 year old Female seen via interactive audio and video telecommunications due to Coronavirus pandemic.  She is agreeable to visit in this format today.  She is at her home and I am at my office.  Patient is in excellent general health.  She tested positive for COVID-19 on July 26 with a home COVID test.  She was seen virtually at the time and was not in any acute distress.  She was treated with a Zithromax Z-PAK and Tessalon Perles and improved.  She does not smoke or consume alcohol.  She takes no chronic medications.  She has had 3 Moderna vaccines-the last one in November 2021.  She called today complaining of cough with some slight sputum production.  Has a scratchy throat but no fever.  Home COVID test  is negative.  She is afraid she might develop bronchitis and would like something prescribed.   Review of Systems     Objective:   Physical Exam Patient is seen virtually in no acute distress.  Does not appear to be tachypneic.  Not heard to be coughing on video visit.       Assessment & Plan:   Acute lower respiratory infection-has tested negative at home for COVID-19.  Has had 3 COVID-19 immunizations the last 1 in November 2021  Plan: Patient will be given Zithromax Z-PAK 2 tabs day 1 followed by 1 tab days 2 through 5.have prescribed Diflucan 150 mg tablet if develops Candida vaginitis while on antibiotics.  Rest and drink fluids.  Call if symptoms worsen or do not improve.  Time spent with this visit is 15 minutes

## 2020-11-14 NOTE — Patient Instructions (Signed)
Take Zithromax Z-PAK 2 tabs day 1 followed by 1 tab days 2 through 5.  Rest and drink fluids.  Call if symptoms worsen.  May take Diflucan if needed for Candida vaginitis while on antibiotics.

## 2021-06-23 ENCOUNTER — Other Ambulatory Visit: Payer: Self-pay | Admitting: Internal Medicine

## 2021-06-23 DIAGNOSIS — Z1231 Encounter for screening mammogram for malignant neoplasm of breast: Secondary | ICD-10-CM

## 2021-06-28 ENCOUNTER — Ambulatory Visit (AMBULATORY_SURGERY_CENTER): Payer: BC Managed Care – PPO | Admitting: *Deleted

## 2021-06-28 VITALS — Ht 71.0 in | Wt 180.0 lb

## 2021-06-28 DIAGNOSIS — Z1211 Encounter for screening for malignant neoplasm of colon: Secondary | ICD-10-CM

## 2021-06-28 NOTE — Progress Notes (Signed)

## 2021-07-08 ENCOUNTER — Encounter: Payer: Self-pay | Admitting: Internal Medicine

## 2021-07-19 ENCOUNTER — Ambulatory Visit (AMBULATORY_SURGERY_CENTER): Payer: BC Managed Care – PPO | Admitting: Internal Medicine

## 2021-07-19 ENCOUNTER — Encounter: Payer: Self-pay | Admitting: Internal Medicine

## 2021-07-19 VITALS — BP 113/81 | HR 62 | Temp 97.1°F | Resp 12 | Ht 69.0 in | Wt 180.0 lb

## 2021-07-19 DIAGNOSIS — D120 Benign neoplasm of cecum: Secondary | ICD-10-CM

## 2021-07-19 DIAGNOSIS — D125 Benign neoplasm of sigmoid colon: Secondary | ICD-10-CM

## 2021-07-19 DIAGNOSIS — D123 Benign neoplasm of transverse colon: Secondary | ICD-10-CM

## 2021-07-19 DIAGNOSIS — D122 Benign neoplasm of ascending colon: Secondary | ICD-10-CM

## 2021-07-19 DIAGNOSIS — Z1211 Encounter for screening for malignant neoplasm of colon: Secondary | ICD-10-CM

## 2021-07-19 MED ORDER — SODIUM CHLORIDE 0.9 % IV SOLN: 500.0000 mL | Freq: Once | INTRAVENOUS | Status: DC

## 2021-07-19 NOTE — Op Note (Addendum)
[place] ?Patient Name: Laura Meyers ?Procedure Date: 07/19/2021 8:40 AM ?MRN: [id number] ?Endoscopist: Sonny Masters "Laura Meyers ,  ?Age: 66 ?Referring MD:  ?Date of Birth: 27-May-1955 ?Gender: Female ?Account #: [id number] ?Procedure:                Colonoscopy ?Indications:              Screening for colorectal malignant neoplasm ?Medicines:                Monitored Anesthesia Care ?Procedure:                Pre-Anesthesia Assessment: ?                          - Prior to the procedure, a History and Physical  ?                          was performed, and patient medications and  ?                          allergies were reviewed. The patient's tolerance of  ?                          previous anesthesia was also reviewed. The risks  ?                          and benefits of the procedure and the sedation  ?                          options and risks were discussed with the patient.  ?                          All questions were answered, and informed consent  ?                          was obtained. Prior Anticoagulants: The patient has  ?                          taken no previous anticoagulant or antiplatelet  ?                          agents. ASA Grade Assessment: II - A patient with  ?                          mild systemic disease. After reviewing the risks  ?                          and benefits, the patient was deemed in  ?                          satisfactory condition to undergo the procedure. ?                          After obtaining informed consent, the colonoscope  ?  was passed under direct vision. Throughout the  ?                          procedure, the patient's blood pressure, pulse, and  ?                          oxygen saturations were monitored continuously. The  ?                          Olympus PCF-H190DL (#[id number]) Colonoscope was  ?                          introduced through the anus and advanced to the the  ?                          terminal ileum.  The colonoscopy was performed  ?                          without difficulty. The patient tolerated the  ?                          procedure well. The quality of the bowel  ?                          preparation was adequate. The terminal ileum,  ?                          ileocecal valve, appendiceal orifice, and rectum  ?                          were photographed. ?Scope In: 8:45:04 AM ?Scope Out: 9:28:11 AM ?Scope Withdrawal Time: 0 hours 31 minutes 45 seconds  ?Total Procedure Duration: 0 hours 43 minutes 7 seconds  ?Findings:                 The terminal ileum appeared normal. ?                          Four sessile polyps were found in the transverse  ?                          colon, ascending colon and cecum. The polyps were 3  ?                          to 15 mm in size. These polyps were removed with a  ?                          cold snare. Resection and retrieval were complete. ?                          A 4 mm polyp was found in the sigmoid colon. The  ?                          polyp was sessile. The polyp was  removed with a  ?                          cold snare. Resection and retrieval were complete. ?                          Non-bleeding internal hemorrhoids were found during  ?                          retroflexion. ?Complications:            No immediate complications. ?Estimated Blood Loss:     Estimated blood loss was minimal. ?Impression:               - The examined portion of the ileum was normal. ?                          - Four 3 to 15 mm polyps in the transverse colon,  ?                          in the ascending colon and in the cecum, removed  ?                          with a cold snare. Resected and retrieved. ?                          - One 4 mm polyp in the sigmoid colon, removed with  ?                          a cold snare. Resected and retrieved. ?                          - Non-bleeding internal hemorrhoids. ?Recommendation:           - Discharge patient to home (with  escort). ?                          - Await pathology results. ?                          - For next colonoscopy, recommend a repeat  ?                          colonoscopy in one year with a two day laxative  ?                          preparation to ensure adequate cleaning. ?                          - The findings and recommendations were discussed  ?                          with the patient. ?Laura Meyers,  ?07/19/2021 9:33:47 AM ?

## 2021-07-19 NOTE — Progress Notes (Signed)
Pt's states no medical or surgical changes since previsit or office visit. 

## 2021-07-19 NOTE — Progress Notes (Signed)
Called to room to assist during endoscopic procedure.  Patient ID and intended procedure confirmed with present staff. Received instructions for my participation in the procedure from the performing physician.  

## 2021-07-19 NOTE — Progress Notes (Signed)
? ?GASTROENTEROLOGY PROCEDURE H&P NOTE  ? ?Primary Care Physician: ?Elby Showers, MD ? ? ? ?Reason for Procedure:   Colon cancer screening ? ?Plan:    Colonoscopy ? ?Patient is appropriate for endoscopic procedure(s) in the ambulatory ([place]) setting. ? ?The nature of the procedure, as well as the risks, benefits, and alternatives were carefully and thoroughly reviewed with the patient. Ample time for discussion and questions allowed. The patient understood, was satisfied, and agreed to proceed.  ? ? ? ?HPI: ?Laura Meyers is a 66 y.o. female who presents for colonoscopy for colon cancer screening. Denies blood in stools, changes in bowel habits, weight loss. Denies fam hx of colon cancer. ? ?Past Medical History:  ?Diagnosis Date  ? Fibrocystic breast disease   ? Functional constipation   ? ? ?Past Surgical History:  ?Procedure Laterality Date  ? ABDOMINAL HYSTERECTOMY    ? BREAST EXCISIONAL BIOPSY Right   ? hammer toes    ? left foot, 2nd toe; 5th toe. right foot, 5th toe  ? KNEE ARTHROSCOPY    ? ? ?Prior to Admission medications   ?Medication Sig Start Date End Date Taking? Authorizing Provider  ?Multiple Vitamin (MULTIVITAMIN) tablet Take 1 tablet by mouth daily.   Yes [provider]  ?TURMERIC PO Take by mouth.   Yes [provider]  ?benzonatate (TESSALON) 100 MG capsule Take 1 capsule (100 mg total) by mouth 3 (three) times daily as needed for cough. 10/13/20   Elby Showers, MD  ?fluconazole (DIFLUCAN) 150 MG tablet Take 1 tablet (150 mg total) by mouth daily. 11/12/20   Elby Showers, MD  ?Vitamin D, Ergocalciferol, (DRISDOL) 1.25 MG (50000 UNIT) CAPS capsule Take 1 capsule (50,000 Units total) by mouth every 7 (seven) days. 07/31/20   Elby Showers, MD  ? ? ?Current Outpatient Medications  ?Medication Sig Dispense Refill  ? Multiple Vitamin (MULTIVITAMIN) tablet Take 1 tablet by mouth daily.    ? TURMERIC PO Take by mouth.    ? benzonatate (TESSALON) 100 MG capsule Take 1 capsule (100  mg total) by mouth 3 (three) times daily as needed for cough. 30 capsule 0  ? fluconazole (DIFLUCAN) 150 MG tablet Take 1 tablet (150 mg total) by mouth daily. 1 tablet 1  ? Vitamin D, Ergocalciferol, (DRISDOL) 1.25 MG (50000 UNIT) CAPS capsule Take 1 capsule (50,000 Units total) by mouth every 7 (seven) days. 13 capsule 3  ? ?Current Facility-Administered Medications  ?Medication Dose Route Frequency Provider Last Rate Last Admin  ? 0.9 %  sodium chloride infusion  500 mL Intravenous Once Sharyn Creamer, MD      ? ? ?Allergies as of 07/19/2021  ? (No Known Allergies)  ? ? ?Family History  ?Problem Relation Age of Onset  ? Cancer Mother   ? Breast cancer Mother   ?     in 58's  ? COPD Father   ? Colon cancer Neg Hx   ? Esophageal cancer Neg Hx   ? Stomach cancer Neg Hx   ? Colon polyps Neg Hx   ? Rectal cancer Neg Hx   ? ? ?Social History  ? ?Socioeconomic History  ? Marital status: Single  ?  Spouse name: Not on file  ? Number of children: Not on file  ? Years of education: Not on file  ? Highest education level: Not on file  ?Occupational History  ? Not on file  ?Tobacco Use  ? Smoking status: Never  ?  Smokeless tobacco: Never  ?Vaping Use  ? Vaping Use: Never used  ?Substance and Sexual Activity  ? Alcohol use: No  ? Drug use: No  ? Sexual activity: Not on file  ?Other Topics Concern  ? Not on file  ?Social History Narrative  ? Not on file  ? ?Social Determinants of Health  ? ?Financial Resource Strain: Not on file  ?Food Insecurity: Not on file  ?Transportation Needs: Not on file  ?Physical Activity: Not on file  ?Stress: Not on file  ?Social Connections: Not on file  ?Intimate Partner Violence: Not on file  ? ? ?Physical Exam: ?Vital signs in last 24 hours: ?BP 110/65   Pulse 66   Temp (!) 97.1 ?F (36.2 ?C)   Ht '5\' 9"'$  (1.753 m)   Wt 180 lb (81.6 kg)   SpO2 100%   BMI 26.58 kg/m?  ?GEN: NAD ?EYE: Sclerae anicteric ?ENT: MMM ?CV: Non-tachycardic ?Pulm: No increased work of breathing ?GI: Soft, NT/ND ?NEURO:   Alert & Oriented ? ? ?[name], MD ?[facility] ? ?07/19/2021 8:36 AM ? ?

## 2021-07-19 NOTE — Patient Instructions (Signed)
Handout given for polyps. ? ?YOU HAD AN ENDOSCOPIC PROCEDURE TODAY AT [facility]:   Refer to the procedure report that was given to you for any specific questions about what was found during the examination.  If the procedure report does not answer your questions, please call your gastroenterologist to clarify.  If you requested that your care partner not be given the details of your procedure findings, then the procedure report has been included in a sealed envelope for you to review at your convenience later. ? ?YOU SHOULD EXPECT: Some feelings of bloating in the abdomen. Passage of more gas than usual.  Walking can help get rid of the air that was put into your GI tract during the procedure and reduce the bloating. If you had a lower endoscopy (such as a colonoscopy or flexible sigmoidoscopy) you may notice spotting of blood in your stool or on the toilet paper. If you underwent a bowel prep for your procedure, you may not have a normal bowel movement for a few days. ? ?Please Note:  You might notice some irritation and congestion in your nose or some drainage.  This is from the oxygen used during your procedure.  There is no need for concern and it should clear up in a day or so. ? ?SYMPTOMS TO REPORT IMMEDIATELY: ? ?Following lower endoscopy (colonoscopy): ? Excessive amounts of blood in the stool ? Significant tenderness or worsening of abdominal pains ? Swelling of the abdomen that is new, acute ? Fever of 100?F or higher ? ?For urgent or emergent issues, a gastroenterologist can be reached at any hour by calling [phone]. ?Do not use MyChart messaging for urgent concerns.  ? ? ?DIET:  We do recommend a small meal at first, but then you may proceed to your regular diet.  Drink plenty of fluids but you should avoid alcoholic beverages for 24 hours. ? ?ACTIVITY:  You should plan to take it easy for the rest of today and you should NOT DRIVE or use heavy machinery until tomorrow (because  of the sedation medicines used during the test).   ? ?FOLLOW UP: ?Our staff will call the number listed on your records 48-72 hours following your procedure to check on you and address any questions or concerns that you may have regarding the information given to you following your procedure. If we do not reach you, we will leave a message.  We will attempt to reach you two times.  During this call, we will ask if you have developed any symptoms of COVID 19. If you develop any symptoms (ie: fever, flu-like symptoms, shortness of breath, cough etc.) before then, please call [phone].  If you test positive for Covid 19 in the 2 weeks post procedure, please call and report this information to [place].   ? ?If any biopsies were taken you will be contacted by phone or by letter within the next 1-3 weeks.  Please call us at [phone] if you have not heard about the biopsies in 3 weeks.  ? ? ?SIGNATURES/CONFIDENTIALITY: ?You and/or your care partner have signed paperwork which will be entered into your electronic medical record.  These signatures attest to the fact that that the information above on your After Visit Summary has been reviewed and is understood.  Full responsibility of the confidentiality of this discharge information lies with you and/or your care-partner.  ?

## 2021-07-19 NOTE — Progress Notes (Signed)
Report to pacu rn; vss. ?

## 2021-07-21 ENCOUNTER — Encounter: Payer: Self-pay | Admitting: Internal Medicine

## 2021-07-21 ENCOUNTER — Telehealth: Payer: Self-pay | Admitting: *Deleted

## 2021-07-21 NOTE — Telephone Encounter (Signed)
?  Follow up Call- ? ? ?  07/19/2021  ?  7:45 AM  ?Call back number  ?Post procedure Call Back phone  # [phone]  ?Permission to leave phone message Yes  ?  ? ?Patient questions: ?Message left to call us if necessary. ?

## 2021-07-29 ENCOUNTER — Other Ambulatory Visit: Payer: Self-pay | Admitting: Internal Medicine

## 2021-07-30 ENCOUNTER — Other Ambulatory Visit: Payer: BC Managed Care – PPO

## 2021-07-30 DIAGNOSIS — Z0000 Encounter for general adult medical examination without abnormal findings: Secondary | ICD-10-CM

## 2021-07-30 DIAGNOSIS — E559 Vitamin D deficiency, unspecified: Secondary | ICD-10-CM

## 2021-07-30 DIAGNOSIS — Z136 Encounter for screening for cardiovascular disorders: Secondary | ICD-10-CM

## 2021-07-30 DIAGNOSIS — R5383 Other fatigue: Secondary | ICD-10-CM

## 2021-07-31 LAB — CBC WITH DIFFERENTIAL/PLATELET
Absolute Monocytes: 292 cells/uL (ref 200–950)
Basophils Absolute: 52 cells/uL (ref 0–200)
Basophils Relative: 1.2 %
Eosinophils Absolute: 60 cells/uL (ref 15–500)
Eosinophils Relative: 1.4 %
HCT: 42.2 % (ref 35.0–45.0)
Hemoglobin: 13.1 g/dL (ref 11.7–15.5)
Lymphs Abs: 1355 cells/uL (ref 850–3900)
MCH: 23.7 pg — ABNORMAL LOW (ref 27.0–33.0)
MCHC: 31 g/dL — ABNORMAL LOW (ref 32.0–36.0)
MCV: 76.4 fL — ABNORMAL LOW (ref 80.0–100.0)
MPV: 11.1 fL (ref 7.5–12.5)
Monocytes Relative: 6.8 %
Neutro Abs: 2541 cells/uL (ref 1500–7800)
Neutrophils Relative %: 59.1 %
Platelets: 229 10*3/uL (ref 140–400)
RBC: 5.52 10*6/uL — ABNORMAL HIGH (ref 3.80–5.10)
RDW: 14.2 % (ref 11.0–15.0)
Total Lymphocyte: 31.5 %
WBC: 4.3 10*3/uL (ref 3.8–10.8)

## 2021-07-31 LAB — LIPID PANEL
Cholesterol: 201 mg/dL — ABNORMAL HIGH (ref <200)
HDL: 91 mg/dL (ref >=50)
LDL Cholesterol (Calc): 96 mg/dL (calc)
Non-HDL Cholesterol (Calc): 110 mg/dL (calc) (ref <130)
Total CHOL/HDL Ratio: 2.2 (calc) (ref <5.0)
Triglycerides: 57 mg/dL (ref <150)

## 2021-07-31 LAB — COMPLETE METABOLIC PANEL WITH GFR
AG Ratio: 1.4 (calc) (ref 1.0–2.5)
ALT: 17 U/L (ref 6–29)
AST: 27 U/L (ref 10–35)
Albumin: 4.3 g/dL (ref 3.6–5.1)
Alkaline phosphatase (APISO): 78 U/L (ref 37–153)
BUN: 15 mg/dL (ref 7–25)
CO2: 27 mmol/L (ref 20–32)
Calcium: 9.9 mg/dL (ref 8.6–10.4)
Chloride: 106 mmol/L (ref 98–110)
Creat: 0.78 mg/dL (ref 0.50–1.05)
Globulin: 3 g/dL (calc) (ref 1.9–3.7)
Glucose, Bld: 83 mg/dL (ref 65–139)
Potassium: 4.7 mmol/L (ref 3.5–5.3)
Sodium: 141 mmol/L (ref 135–146)
Total Bilirubin: 0.5 mg/dL (ref 0.2–1.2)
Total Protein: 7.3 g/dL (ref 6.1–8.1)
eGFR: 85 mL/min/{1.73_m2} (ref >=60)

## 2021-07-31 LAB — VITAMIN D 25 HYDROXY (VIT D DEFICIENCY, FRACTURES): Vit D, 25-Hydroxy: 107 ng/mL — ABNORMAL HIGH (ref 30–100)

## 2021-07-31 LAB — TSH: TSH: 2.36 mIU/L (ref 0.40–4.50)

## 2021-08-06 ENCOUNTER — Ambulatory Visit (INDEPENDENT_AMBULATORY_CARE_PROVIDER_SITE_OTHER): Payer: BC Managed Care – PPO | Admitting: Internal Medicine

## 2021-08-06 ENCOUNTER — Ambulatory Visit
Admission: RE | Admit: 2021-08-06 | Discharge: 2021-08-06 | Disposition: A | Discharge status: Home / Self Care | Payer: BC Managed Care – PPO | Source: Ambulatory Visit | Attending: Internal Medicine | Admitting: Internal Medicine

## 2021-08-06 ENCOUNTER — Encounter: Payer: Self-pay | Admitting: Internal Medicine

## 2021-08-06 VITALS — BP 98/64 | HR 67 | Temp 97.7°F | Ht 68.0 in | Wt 165.8 lb

## 2021-08-06 DIAGNOSIS — Z1231 Encounter for screening mammogram for malignant neoplasm of breast: Secondary | ICD-10-CM

## 2021-08-06 DIAGNOSIS — Z0000 Encounter for general adult medical examination without abnormal findings: Secondary | ICD-10-CM | POA: Diagnosis not present

## 2021-08-06 LAB — POCT URINALYSIS DIPSTICK
Bilirubin, UA: NEGATIVE
Blood, UA: NEGATIVE
Glucose, UA: NEGATIVE
Ketones, UA: NEGATIVE
Leukocytes, UA: NEGATIVE
Nitrite, UA: NEGATIVE
Protein, UA: NEGATIVE
Spec Grav, UA: <=1.005 — AB (ref 1.010–1.025)
Urobilinogen, UA: 0.2 E.U./dL
pH, UA: 7.5 (ref 5.0–8.0)

## 2021-08-06 NOTE — Patient Instructions (Addendum)
Decrease frequency of Vitmain gummies you are taking. Due for colonoscopy in 3 years. Declines Shingrix and Pneumococcal vaccines.  Tetanus immunization is up-to-date.  It was a pleasure to see you today.  Return in 1 year or as needed.  Mammogram results pending.

## 2021-08-06 NOTE — Progress Notes (Signed)
   Subjective:    Patient ID: Laura Meyers, female    DOB: 02/15/1956, 66 y.o.   MRN: [id number]  HPI 66 year old Female seen for health maintenance exam.  Her general health is excellent.    Vaccines reviewed: Tetanus immunization is up-to-date having been given in May 2022. Declines  Pneumococcal 20 vaccine. Declines Zoster vaccine.   She underwent screening colonoscopy recently and had polyps (sessile serrated adenoma and tubular adenoma ) on recent colonosocopy with f/u recommended in 3 years.  Pap smear due 2024.  Having mammogram today.  She has no known drug allergies.  History of fibrocystic breast disease and functional constipation.  Remote history of benign positional vertigo.  Additional history: Hysterectomy without oophorectomy for fibroids in 2002.  Benign right breast biopsy in 2002.  History of surgery for left hammertoe.  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 Noemi Chapel in July 1995.  Social history: She is divorced.  No children.  Does not smoke or consume alcohol.  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  Also previously worked in the [employer].  She is now working in Museum/gallery exhibitions officer at [employer].  Family history: Mother died of breast cancer in her 60s with history of hypertension.  Sister with history of hypertension.  Father died of COPD in his 22s.  She has 2 brothers and 2 sisters.    Review of Systems Knees hurt - mainly right knee. Has had several cortisone injections per Dr. Maureen Ralphs but not in a couple of years.  May need reevaluation with him.  Otherwise has no new complaints.     Objective:   Physical Exam Blood pressure 98/64.  Pulse 67.  Temperature 97.7 degrees (ear thermometer used).  Weight is 165 pounds 12 ounces BMI 25.20 height 5 feet 8 inches Skin: Warm and  dry.  Nodes none.  TMs are clear.  Neck is supple without JVD thyromegaly or carotid bruits.  Chest is clear to auscultation.  Cardiac exam: Regular rate and rhythm without murmurs.  No ectopy.  Abdomen is soft nondistended without hepatosplenomegaly masses or tenderness.  Bimanual exam is normal.  Pap done 2021.  Neurological exam is intact without gross focal deficits.  No lower extremity edema.  Affect thought and judgment are normal.      Assessment & Plan:  History of sessile serrated adenoma and tubular adenoma on recent colonoscopy.  Repeat study to be done in 3 years.  Knee pain-may need reevaluation with Dr. Reynaldo Minium.  Family history of breast cancer in her mother.  History of vitamin D deficiency-currently taking vitamin D Gummies.  Has previously taken high-dose vitamin D weekly.  Vitamin D level is currently 107 but was 20 when checked 2 years ago.  Maybe not take quite as many Gummies.  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  Plan: Tetanus immunization is up-to-date.  Asked patient to consider pneumococcal 20 vaccine and Shingrix vaccines.  Continue high-dose vitamin D weekly.  May need to see orthopedist regarding knee pain.  Mammogram results pending.  Follow-up in 1 year or as needed.

## 2021-08-10 ENCOUNTER — Other Ambulatory Visit: Payer: Self-pay | Admitting: Internal Medicine

## 2021-08-10 DIAGNOSIS — R928 Other abnormal and inconclusive findings on diagnostic imaging of breast: Secondary | ICD-10-CM

## 2021-08-12 ENCOUNTER — Other Ambulatory Visit: Payer: Self-pay | Admitting: Internal Medicine

## 2021-08-12 ENCOUNTER — Ambulatory Visit
Admission: RE | Admit: 2021-08-12 | Discharge: 2021-08-12 | Disposition: A | Discharge status: Home / Self Care | Payer: BC Managed Care – PPO | Source: Ambulatory Visit | Attending: Internal Medicine | Admitting: Internal Medicine

## 2021-08-12 DIAGNOSIS — R928 Other abnormal and inconclusive findings on diagnostic imaging of breast: Secondary | ICD-10-CM

## 2021-08-12 DIAGNOSIS — R921 Mammographic calcification found on diagnostic imaging of breast: Secondary | ICD-10-CM

## 2021-11-10 ENCOUNTER — Telehealth: Payer: Self-pay | Admitting: Internal Medicine

## 2021-11-10 ENCOUNTER — Encounter: Payer: Self-pay | Admitting: Internal Medicine

## 2021-11-10 ENCOUNTER — Telehealth (INDEPENDENT_AMBULATORY_CARE_PROVIDER_SITE_OTHER): Payer: BC Managed Care – PPO | Admitting: Internal Medicine

## 2021-11-10 DIAGNOSIS — J22 Unspecified acute lower respiratory infection: Secondary | ICD-10-CM

## 2021-11-10 DIAGNOSIS — Z8639 Personal history of other endocrine, nutritional and metabolic disease: Secondary | ICD-10-CM

## 2021-11-10 MED ORDER — FLUCONAZOLE 150 MG PO TABS: 150.0000 mg | ORAL_TABLET | Freq: Once | ORAL | 0 refills | Status: AC

## 2021-11-10 MED ORDER — AZITHROMYCIN 250 MG PO TABS: ORAL_TABLET | ORAL | 0 refills | Status: AC

## 2021-11-10 NOTE — Patient Instructions (Signed)
Take Zithromax Z-PAK 2 tabs day 1 followed by 1 tab days 2 through 5.  Diflucan 150 mg tablet to take if she develops symptoms of Candida vaginitis while on antibiotics.  Rest and drink plenty of fluids.  Monitor symptoms and monitor temperature.  I think she should repeat her COVID test today or tomorrow for completeness.

## 2021-11-10 NOTE — Telephone Encounter (Signed)
scheduled

## 2021-11-10 NOTE — Progress Notes (Signed)
   Subjective:    Patient ID: Laura Meyers, female    DOB: 18-Jul-1955, 66 y.o.   MRN: [id number]  HPI 66 year old Female seen today by interactive audio and video telecommunications.  She is identified using 2 identifiers as Laura Meyers, a patient in this practice.  She is at the hair salon and I am at my office.  She is agreeable to visit in this format today.  Her general health is excellent.  She has a history of vitamin D deficiency and takes high-dose vitamin D weekly.  Otherwise takes no chronic medications.  Patient called today saying she started having a congested cough on Sunday with runny nose and white sputum production.  Now has developed some discolored sputum production.  Denies sore throat or fever.  COVID test was negative on Sunday.  Has been taking Robitussin-DM with honey.    Review of Systems denies fever, shaking chills, nausea or vomiting.  No shortness of breath.     Objective:   Physical Exam  Seen  virtually in no acute distress but has deep congested cough on video visit today.      Assessment & Plan:  Acute lower respiratory infection  Plan: Zithromax Z-PAK 2 tabs day 1 followed by 1 tab days 2 through 5.  Continue with Robitussin for cough.  Diflucan 150 mg tablet to take should she develop symptoms of Candida vaginitis while on antibiotics.  Rest and drink fluids.  I think she should repeat her COVID test today or tomorrow.

## 2021-11-10 NOTE — Telephone Encounter (Signed)
Laura Meyers [phone]  Laura Meyers called to say she started getting sick on Sunday with cough, she now has runny nose and white mucus, it was yellow, no sore throat or fever, COVID test was negative on Sunday, she has been taking Robitussin DM with honey since Sunday. She can do another COVID test this afternoon and call in the morning or would you like to see her today?

## 2022-02-14 ENCOUNTER — Ambulatory Visit
Admission: RE | Admit: 2022-02-14 | Discharge: 2022-02-14 | Disposition: A | Discharge status: Home / Self Care | Payer: BC Managed Care – PPO | Source: Ambulatory Visit | Attending: Internal Medicine | Admitting: Internal Medicine

## 2022-02-14 ENCOUNTER — Other Ambulatory Visit: Payer: Self-pay | Admitting: Internal Medicine

## 2022-02-14 DIAGNOSIS — R921 Mammographic calcification found on diagnostic imaging of breast: Secondary | ICD-10-CM

## 2022-07-08 ENCOUNTER — Other Ambulatory Visit: Payer: Self-pay | Admitting: Internal Medicine

## 2022-08-02 NOTE — Progress Notes (Addendum)
Patient Care Team: Margaree Mackintosh, MD as PCP - General (Internal Medicine)  Visit Date: 08/09/22  Subjective:    Patient ID: Laura Meyers , Female   DOB: 01/16/1956, 67 y.o.    MRN: [id number]   67 y.o. Female presents today for an annual comprehensive physical exam.  She has no known drug allergies.  History of fibrocystic breast disease and functional constipation.  Remote history of benign positional vertigo.   Taking multivitamin, turmeric, Vitamin D 50,000 units weekly.  Walks regularly and otherwise stays active.  Glucose normal. Kidney, liver functions normal. Electrolytes normal. Blood proteins normal. RBC elevated at 5.3, MCV low at 76.2, MCH at 24, MCHC at 31.4. 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 LDL slightly elevated at 100, CHOL at 204. TSH at 2.56. Urinalysis normal.  Additional history: Hysterectomy without oophorectomy for fibroids in 2002. Benign right breast biopsy in 2002. History of surgery for left hammertoe. 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 Thurston Hole in July 1995.   Pap smear last completed 06/28/19. Negative for intraepithelial lesion or malignancy. Recommended repeat in 2024.  Mammogram last completed 08/05/22. Stable probably benign right breast calcifications, no mammographic evidence of malignancy in either breast. Recommended repeat in 2025.  Colonoscopy last completed 07/19/21. Results showed four 3-15 mm polyps in transverse, ascending colon and cecum, one 4 mm polyp in sigmoid colon, non-bleeding internal hemorrhoids. Pathology showed sessile serrated polyps, tubular adenomas. Recommended repeat in 2024.   Social history: She is divorced.  No children.  Does not smoke or consume alcohol.  She has worked as a Merchandiser, retail in Patent examiner at [employer] and prior to that worked at [employer]  in [place].  Also previously worked in the  [employer].  She is now working in [employer] at [employer].  Family history: Mother died of breast cancer in her 6s with history of hypertension.  Sister with history of hypertension.  Father died of COPD in his 52s.  She has 2 brothers and 2 sisters.   Past Medical History:  Diagnosis Date   Fibrocystic breast disease    Functional constipation      Family History  Problem Relation Age of Onset   Cancer Mother    Breast cancer Mother        in 75's   COPD Father    Breast cancer Sister 47   Colon cancer Neg Hx    Esophageal cancer Neg Hx    Stomach cancer Neg Hx    Colon polyps Neg Hx    Rectal cancer Neg Hx     Social History   Social History Narrative   Not on file      Review of Systems  Constitutional:  Negative for chills, fever, malaise/fatigue and weight loss.  HENT:  Negative for hearing loss, sinus pain and sore throat.   Respiratory:  Negative for cough, hemoptysis and shortness of breath.   Cardiovascular:  Negative for chest pain, palpitations, leg swelling and PND.  Gastrointestinal:  Negative for abdominal pain, constipation, diarrhea, heartburn, nausea and vomiting.  Genitourinary:  Negative for dysuria, frequency and urgency.  Musculoskeletal:  Negative for back pain, myalgias and neck pain.  Skin:  Negative for itching and rash.  Neurological:  Negative for dizziness, tingling, seizures and headaches.  Endo/Heme/Allergies:  Negative for polydipsia.  Psychiatric/Behavioral:  Negative for depression. The patient is not nervous/anxious.  Objective:   Vitals: BP 110/72   Pulse 87   Temp 97.9 F (36.6 C) (Tympanic)   Ht 5\' 8"  (1.727 m)   Wt 166 lb 6.4 oz (75.5 kg)   SpO2 99%   BMI 25.30 kg/m    Physical Exam Vitals and nursing note reviewed.  Constitutional:      General: She is not in acute distress.    Appearance: Normal appearance. She is not ill-appearing or toxic-appearing.   HENT:     Head: Normocephalic and atraumatic.     Right Ear: Hearing, tympanic membrane, ear canal and external ear normal. There is impacted cerumen.     Left Ear: Hearing, tympanic membrane, ear canal and external ear normal.     Mouth/Throat:     Pharynx: Oropharynx is clear.  Eyes:     Extraocular Movements: Extraocular movements intact.     Pupils: Pupils are equal, round, and reactive to light.  Neck:     Thyroid: No thyroid mass, thyromegaly or thyroid tenderness.     Vascular: No carotid bruit.  Cardiovascular:     Rate and Rhythm: Normal rate and regular rhythm. No extrasystoles are present.    Pulses:          Dorsalis pedis pulses are 1+ on the right side and 1+ on the left side.     Heart sounds: Normal heart sounds. No murmur heard.    No friction rub. No gallop.  Pulmonary:     Effort: Pulmonary effort is normal.     Breath sounds: Normal breath sounds. No decreased breath sounds, wheezing, rhonchi or rales.  Chest:     Chest wall: No mass.     Comments: Fibrocystic changes. Abdominal:     Palpations: Abdomen is soft. There is no hepatomegaly, splenomegaly or mass.     Tenderness: There is no abdominal tenderness.     Hernia: No hernia is present.  Genitourinary:    Comments: NFEG. Bimanual normal. Pap taken from vaginal cuff. Musculoskeletal:     Cervical back: Normal range of motion.     Right lower leg: No edema.     Left lower leg: No edema.  Lymphadenopathy:     Cervical: No cervical adenopathy.     Upper Body:     Right upper body: No supraclavicular adenopathy.     Left upper body: No supraclavicular adenopathy.  Skin:    General: Skin is warm and dry.  Neurological:     General: No focal deficit present.     Mental Status: She is alert and oriented to person, place, and time. Mental status is at baseline.     Sensory: Sensation is intact.     Motor: Motor function is intact. No weakness.     Deep Tendon Reflexes: Reflexes are normal and  symmetric.  Psychiatric:        Attention and Perception: Attention normal.        Mood and Affect: Mood normal.        Speech: Speech normal.        Behavior: Behavior normal.        Thought Content: Thought content normal.        Cognition and Memory: Cognition normal.        Judgment: Judgment normal.       Results:   Studies obtained and personally reviewed by me:  Pap smear last completed 06/28/19. Negative for intraepithelial lesion or malignancy. Recommended repeat in 2024.  Mammogram last completed 08/05/22.  Stable probably benign right breast calcifications, no mammographic evidence of malignancy in either breast. Recommended repeat in 2025.  Colonoscopy last completed 07/19/21. Results showed four 3-15 mm polyps in transverse, ascending colon and cecum, one 4 mm polyp in sigmoid colon, non-bleeding internal hemorrhoids. Pathology showed sessile serrated polyps, tubular adenomas. Recommended repeat in 2026.  Labs:       Component Value Date/Time   NA 140 08/08/2022 0912   K 4.6 08/08/2022 0912   CL 105 08/08/2022 0912   CO2 29 08/08/2022 0912   GLUCOSE 94 08/08/2022 0912   BUN 15 08/08/2022 0912   CREATININE 0.78 08/08/2022 0912   CALCIUM 9.6 08/08/2022 0912   PROT 6.9 08/08/2022 0912   ALBUMIN 3.8 05/06/2016 1023   AST 23 08/08/2022 0912   ALT 15 08/08/2022 0912   ALKPHOS 72 05/06/2016 1023   BILITOT 0.6 08/08/2022 0912   GFRNONAA 85 07/24/2020 0942   GFRAA 99 07/24/2020 0942     Lab Results  Component Value Date   WBC 5.0 08/08/2022   HGB 12.7 08/08/2022   HCT 40.4 08/08/2022   MCV 76.2 (L) 08/08/2022   PLT 235 08/08/2022    Lab Results  Component Value Date   CHOL 204 (H) 08/08/2022   HDL 90 08/08/2022   LDLCALC 100 (H) 08/08/2022   TRIG 59 08/08/2022   CHOLHDL 2.3 08/08/2022    No results found for: "HGBA1C"   Lab Results  Component Value Date   TSH 2.56 08/08/2022      Assessment & Plan:   History of vitamin D deficiency: continue with  Vitamin D 50,000 units weekly.  Pap smear: last completed 06/28/19. Negative for intraepithelial lesion or malignancy. Pap taken today.  Mammogram: last completed 08/05/22. Stable probably benign right breast calcifications, no mammographic evidence of malignancy in either breast. Recommended repeat in 2025.  Colonoscopy: last completed 07/19/21. Results showed four 3-15 mm polyps in transverse, ascending colon and cecum, one 4 mm polyp in sigmoid colon, non-bleeding internal hemorrhoids. Pathology showed sessile serrated polyps, tubular adenomas. Recommended repeat in 2024.  Vaccine counseling: due for shingles vaccine. UTD on flu, tetanus vaccines. Declines pneumococcal 20 vaccine.    I,Alexander Ruley,acting as a Neurosurgeon for Margaree Mackintosh, MD.,have documented all relevant documentation on the behalf of Margaree Mackintosh, MD,as directed by  Margaree Mackintosh, MD while in the presence of Margaree Mackintosh, MD.   I, Margaree Mackintosh, MD, have reviewed all documentation for this visit. The documentation on 09/10/22 for the exam, diagnosis, procedures, and orders are all accurate and complete.

## 2022-08-05 ENCOUNTER — Other Ambulatory Visit: Payer: BC Managed Care – PPO

## 2022-08-05 ENCOUNTER — Ambulatory Visit
Admission: RE | Admit: 2022-08-05 | Discharge: 2022-08-05 | Disposition: A | Discharge status: Home / Self Care | Payer: BC Managed Care – PPO | Source: Ambulatory Visit | Attending: Internal Medicine | Admitting: Internal Medicine

## 2022-08-05 DIAGNOSIS — R5383 Other fatigue: Secondary | ICD-10-CM

## 2022-08-05 DIAGNOSIS — R921 Mammographic calcification found on diagnostic imaging of breast: Secondary | ICD-10-CM

## 2022-08-05 DIAGNOSIS — Z136 Encounter for screening for cardiovascular disorders: Secondary | ICD-10-CM

## 2022-08-05 DIAGNOSIS — Z13220 Encounter for screening for lipoid disorders: Secondary | ICD-10-CM

## 2022-08-05 NOTE — Addendum Note (Signed)
Addended by: Mary Sella D on: 08/05/2022 02:33 PM   Modules accepted: Orders

## 2022-08-08 ENCOUNTER — Other Ambulatory Visit (INDEPENDENT_AMBULATORY_CARE_PROVIDER_SITE_OTHER): Payer: BC Managed Care – PPO

## 2022-08-08 DIAGNOSIS — Z136 Encounter for screening for cardiovascular disorders: Secondary | ICD-10-CM

## 2022-08-08 DIAGNOSIS — Z0000 Encounter for general adult medical examination without abnormal findings: Secondary | ICD-10-CM | POA: Diagnosis not present

## 2022-08-08 DIAGNOSIS — R5383 Other fatigue: Secondary | ICD-10-CM

## 2022-08-08 DIAGNOSIS — Z13220 Encounter for screening for lipoid disorders: Secondary | ICD-10-CM

## 2022-08-08 LAB — CBC WITH DIFFERENTIAL/PLATELET
Absolute Monocytes: 270 cells/uL (ref 200–950)
Basophils Absolute: 40 cells/uL (ref 0–200)
Basophils Relative: 0.8 %
Eosinophils Absolute: 140 cells/uL (ref 15–500)
Eosinophils Relative: 2.8 %
HCT: 40.4 % (ref 35.0–45.0)
Hemoglobin: 12.7 g/dL (ref 11.7–15.5)
Lymphs Abs: 1180 cells/uL (ref 850–3900)
MCH: 24 pg — ABNORMAL LOW (ref 27.0–33.0)
MCHC: 31.4 g/dL — ABNORMAL LOW (ref 32.0–36.0)
MCV: 76.2 fL — ABNORMAL LOW (ref 80.0–100.0)
MPV: 11.1 fL (ref 7.5–12.5)
Monocytes Relative: 5.4 %
Neutro Abs: 3370 cells/uL (ref 1500–7800)
Neutrophils Relative %: 67.4 %
Platelets: 235 10*3/uL (ref 140–400)
RBC: 5.3 10*6/uL — ABNORMAL HIGH (ref 3.80–5.10)
RDW: 14.5 % (ref 11.0–15.0)
Total Lymphocyte: 23.6 %
WBC: 5 10*3/uL (ref 3.8–10.8)

## 2022-08-08 LAB — POCT URINALYSIS DIPSTICK
Bilirubin, UA: NEGATIVE
Blood, UA: NEGATIVE
Glucose, UA: NEGATIVE
Ketones, UA: NEGATIVE
Leukocytes, UA: NEGATIVE
Nitrite, UA: NEGATIVE
Protein, UA: NEGATIVE
Spec Grav, UA: 1.01 (ref 1.010–1.025)
Urobilinogen, UA: 0.2 E.U./dL
pH, UA: 7 (ref 5.0–8.0)

## 2022-08-08 LAB — COMPLETE METABOLIC PANEL WITH GFR
AG Ratio: 1.5 (calc) (ref 1.0–2.5)
ALT: 15 U/L (ref 6–29)
AST: 23 U/L (ref 10–35)
Albumin: 4.1 g/dL (ref 3.6–5.1)
Alkaline phosphatase (APISO): 76 U/L (ref 37–153)
BUN: 15 mg/dL (ref 7–25)
CO2: 29 mmol/L (ref 20–32)
Calcium: 9.6 mg/dL (ref 8.6–10.4)
Chloride: 105 mmol/L (ref 98–110)
Creat: 0.78 mg/dL (ref 0.50–1.05)
Globulin: 2.8 g/dL (calc) (ref 1.9–3.7)
Glucose, Bld: 94 mg/dL (ref 65–99)
Potassium: 4.6 mmol/L (ref 3.5–5.3)
Sodium: 140 mmol/L (ref 135–146)
Total Bilirubin: 0.6 mg/dL (ref 0.2–1.2)
Total Protein: 6.9 g/dL (ref 6.1–8.1)
eGFR: 85 mL/min/{1.73_m2} (ref >=60)

## 2022-08-08 LAB — TSH: TSH: 2.56 mIU/L (ref 0.40–4.50)

## 2022-08-08 LAB — LIPID PANEL
Cholesterol: 204 mg/dL — ABNORMAL HIGH (ref <200)
HDL: 90 mg/dL (ref >=50)
LDL Cholesterol (Calc): 100 mg/dL (calc) — ABNORMAL HIGH
Non-HDL Cholesterol (Calc): 114 mg/dL (calc) (ref <130)
Total CHOL/HDL Ratio: 2.3 (calc) (ref <5.0)
Triglycerides: 59 mg/dL (ref <150)

## 2022-08-08 NOTE — Addendum Note (Signed)
Addended by: Mary Sella D on: 08/08/2022 11:08 AM   Modules accepted: Orders

## 2022-08-09 ENCOUNTER — Ambulatory Visit (INDEPENDENT_AMBULATORY_CARE_PROVIDER_SITE_OTHER): Payer: BC Managed Care – PPO | Admitting: Internal Medicine

## 2022-08-09 ENCOUNTER — Encounter: Payer: Self-pay | Admitting: Internal Medicine

## 2022-08-09 ENCOUNTER — Other Ambulatory Visit (HOSPITAL_COMMUNITY)
Admission: RE | Admit: 2022-08-09 | Discharge: 2022-08-09 | Disposition: A | Discharge status: Home / Self Care | Payer: BC Managed Care – PPO | Source: Ambulatory Visit | Attending: Internal Medicine | Admitting: Internal Medicine

## 2022-08-09 VITALS — BP 110/72 | HR 87 | Temp 97.9°F | Ht 68.0 in | Wt 166.4 lb

## 2022-08-09 DIAGNOSIS — Z8639 Personal history of other endocrine, nutritional and metabolic disease: Secondary | ICD-10-CM

## 2022-08-09 DIAGNOSIS — Z0000 Encounter for general adult medical examination without abnormal findings: Secondary | ICD-10-CM

## 2022-08-11 LAB — CYTOLOGY - PAP
Comment: NEGATIVE
Diagnosis: NEGATIVE
High risk HPV: NEGATIVE

## 2022-09-10 NOTE — Patient Instructions (Addendum)
It was a pleasure to see you today. Continue Vitamin D supplement weekly. Tetanus vaccine is up to date. Colonoscopy and mammogram are up to date.

## 2022-11-11 ENCOUNTER — Ambulatory Visit
Admission: EM | Admit: 2022-11-11 | Discharge: 2022-11-11 | Disposition: A | Discharge status: Home / Self Care | Payer: BC Managed Care – PPO | Attending: Internal Medicine | Admitting: Internal Medicine

## 2022-11-11 DIAGNOSIS — R21 Rash and other nonspecific skin eruption: Secondary | ICD-10-CM

## 2022-11-11 MED ORDER — TRIAMCINOLONE ACETONIDE 0.1 % EX CREA
1.0000 | TOPICAL_CREAM | Freq: Two times a day (BID) | CUTANEOUS | 0 refills | Status: AC
Start: 2022-11-11 — End: ?

## 2022-11-11 NOTE — ED Triage Notes (Signed)
Pt presents to UC w/ c/o bumps on right knee since x2 days. Pt tried a&d ointment and anti itch cream without relief.

## 2022-11-11 NOTE — ED Provider Notes (Signed)
[facility]    CSN: [id number] Arrival date & time: 11/11/22  1530      History   Chief Complaint No chief complaint on file.   HPI Laura Meyers is a 67 y.o. female presents for evaluation of a rash.  Patient reports 2 days of a pruritic rash on her right knee.  Is not sure if she was bitten by an insect.  Denies any swelling, drainage, fevers, chills.  No history of eczema or psoriasis.  Denies any new contacts including soaps, medications, etc.  Denies yard work prior to onset of symptoms.  She has been using over-the-counter A&E ointment and anti-itch cream with minimal relief.  HPI  Past Medical History:  Diagnosis Date   Fibrocystic breast disease    Functional constipation     Patient Active Problem List   Diagnosis Date Noted   Fibrocystic breast disease 05/07/2011   Nocturnal leg cramps 05/07/2011   H/O vitamin D deficiency 05/07/2011   Constipation - functional 05/07/2011    Past Surgical History:  Procedure Laterality Date   ABDOMINAL HYSTERECTOMY     BREAST EXCISIONAL BIOPSY Right    hammer toes     left foot, 2nd toe; 5th toe. right foot, 5th toe   KNEE ARTHROSCOPY      OB History   No obstetric history on file.      Home Medications    Prior to Admission medications   Medication Sig Start Date End Date Taking? Authorizing Provider  triamcinolone cream (KENALOG) 0.1 % Apply 1 Application topically 2 (two) times daily. 11/11/22  Yes Radford Pax, NP  Multiple Vitamin (MULTIVITAMIN) tablet Take 1 tablet by mouth daily.    [provider]  TURMERIC PO Take by mouth.    [provider]  UNABLE TO FIND Med Name: Herbal Life Total control    [provider]  Vitamin D, Ergocalciferol, (DRISDOL) 1.25 MG (50000 UNIT) CAPS capsule TAKE 1 CAPSULE BY MOUTH EVERY 7 DAYS 07/08/22   Margaree Mackintosh, MD    Family History Family History  Problem Relation Age of Onset   Cancer Mother    Breast cancer Mother        in 29's    COPD Father    Breast cancer Sister 27   Colon cancer Neg Hx    Esophageal cancer Neg Hx    Stomach cancer Neg Hx    Colon polyps Neg Hx    Rectal cancer Neg Hx     Social History Social History   Tobacco Use   Smoking status: Never   Smokeless tobacco: Never  Vaping Use   Vaping status: Never Used  Substance Use Topics   Alcohol use: No   Drug use: No     Allergies   Patient has no known allergies.   Review of Systems Review of Systems  Skin:  Positive for rash.     Physical Exam Triage Vital Signs ED Triage Vitals  Encounter Vitals Group     BP 11/11/22 1609 124/76     Systolic BP Percentile --      Diastolic BP Percentile --      Pulse Rate 11/11/22 1609 83     Resp 11/11/22 1609 16     Temp 11/11/22 1609 98 F (36.7 C)     Temp Source 11/11/22 1609 Oral     SpO2 11/11/22 1609 97 %     Weight --      Height --  Head Circumference --      Peak Flow --      Pain Score 11/11/22 1611 0     Pain Loc --      Pain Education --      Exclude from Growth Chart --    No data found.  Updated Vital Signs BP 124/76 (BP Location: Right Arm)   Pulse 83   Temp 98 F (36.7 C) (Oral)   Resp 16   SpO2 97%   Visual Acuity Right Eye Distance:   Left Eye Distance:   Bilateral Distance:    Right Eye Near:   Left Eye Near:    Bilateral Near:     Physical Exam Vitals and nursing note reviewed.  Constitutional:      General: She is not in acute distress.    Appearance: Normal appearance. She is not ill-appearing.  HENT:     Head: Normocephalic and atraumatic.  Eyes:     Pupils: Pupils are equal, round, and reactive to light.  Cardiovascular:     Rate and Rhythm: Normal rate.  Pulmonary:     Effort: Pulmonary effort is normal.  Skin:    General: Skin is warm and dry.          Comments: Mildly erythematous papular rash to the right lateral knee.  No swelling, drainage, warmth.  No vesicles or pustules  Neurological:     General: No focal  deficit present.     Mental Status: She is alert and oriented to person, place, and time.  Psychiatric:        Mood and Affect: Mood normal.        Behavior: Behavior normal.      UC Treatments / Results  Labs (all labs ordered are listed, but only abnormal results are displayed) Labs Reviewed - No data to display  EKG   Radiology No results found.  Procedures Procedures (including critical care time)  Medications Ordered in UC Medications - No data to display  Initial Impression / Assessment and Plan / UC Course  I have reviewed the triage vital signs and the nursing notes.  Pertinent labs & imaging results that were available during my care of the patient were reviewed by me and considered in my medical decision making (see chart for details).     Reviewed exam and symptoms with patient.  No red flags.  Unclear cause of rash, trial of Kenalog topical steroid cream.  PCP follow-up if symptoms do not improve.  ER precautions reviewed and patient verbalized understanding. Final Clinical Impressions(s) / UC Diagnoses   Final diagnoses:  Rash and nonspecific skin eruption     Discharge Instructions      Start Kenalog topical steroid cream twice daily to the area as needed.  You can continue A&E ointment if needed.  Follow-up with your PCP if your symptoms do not improve.  Please go the emergency room if you develop any worsening symptoms.  I hope you feel better soon!    ED Prescriptions     Medication Sig Dispense Auth. Provider   triamcinolone cream (KENALOG) 0.1 % Apply 1 Application topically 2 (two) times daily. 30 g Radford Pax, NP      PDMP not reviewed this encounter.   Radford Pax, NP 11/11/22 [phone]

## 2022-11-11 NOTE — Discharge Instructions (Addendum)
Start Kenalog topical steroid cream twice daily to the area as needed.  You can continue A&E ointment if needed.  Follow-up with your PCP if your symptoms do not improve.  Please go the emergency room if you develop any worsening symptoms.  I hope you feel better soon!

## 2023-01-27 ENCOUNTER — Telehealth: Payer: Self-pay | Admitting: Internal Medicine

## 2023-01-27 NOTE — Telephone Encounter (Signed)
Left a voice message to call back, that we need to reschedule her CPE it is schedule for Friday 08/11/2023 and we will be closed that day, so we need to schedule for Thursday 08/10/2022 or any day the next week.

## 2023-02-09 NOTE — Telephone Encounter (Signed)
LVM to CB and reschedule because we will be out of office that day.

## 2023-02-09 NOTE — Telephone Encounter (Signed)
scheduled

## 2023-05-15 ENCOUNTER — Encounter: Payer: Self-pay | Admitting: Internal Medicine

## 2023-05-16 ENCOUNTER — Encounter: Payer: Self-pay | Admitting: Internal Medicine

## 2023-05-16 ENCOUNTER — Ambulatory Visit (INDEPENDENT_AMBULATORY_CARE_PROVIDER_SITE_OTHER): Payer: Self-pay | Admitting: Internal Medicine

## 2023-05-16 VITALS — BP 120/80 | HR 83 | Temp 99.9°F | Ht 68.0 in | Wt 166.0 lb

## 2023-05-16 DIAGNOSIS — J22 Unspecified acute lower respiratory infection: Secondary | ICD-10-CM

## 2023-05-16 MED ORDER — AZITHROMYCIN 250 MG PO TABS: ORAL_TABLET | ORAL | 0 refills | Status: AC

## 2023-05-16 MED ORDER — METHYLPREDNISOLONE ACETATE 80 MG/ML IJ SUSP
80.0000 mg | Freq: Once | INTRAMUSCULAR | Status: AC
Start: 2023-05-16 — End: 2023-05-16
  Administered 2023-05-16: 80 mg via INTRAMUSCULAR

## 2023-05-16 MED ORDER — HYDROCODONE BIT-HOMATROP MBR 5-1.5 MG/5ML PO SOLN: 5.0000 mL | Freq: Three times a day (TID) | ORAL | 0 refills | Status: AC | PRN

## 2023-05-16 MED ORDER — FLUCONAZOLE 150 MG PO TABS: 150.0000 mg | ORAL_TABLET | Freq: Once | ORAL | 0 refills | Status: AC

## 2023-05-16 NOTE — Progress Notes (Signed)
 Patient Care Team: Laura Mackintosh, MD as PCP - General (Internal Medicine)  Visit Date: 05/16/23  Subjective:   Chief Complaint  Patient presents with   Cough   Nasal Congestion  Patient ZO:XWRUEA A Sabbagh,Female DOB:09-06-1955,67 y.o. VWU:[id number]   68 y.o. Female presents today for acute sick visit with Cough. Reported via MyChart message yesterday  that her symptoms began with her ears itching and sneezing, for which she started taking OTC allergy relief and Robitussin DM for her cough, which she is now expectorating thick yellow phlegm. Denies fever and notes that she was NEG for Covid-19 on an at-home test. Today reports she woke up with voice hoarseness. States that she has been waking up with dry area in the back of her throat that improves after drinking something. Denies watery eyes, eye pain, or nasal congestion.  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 Exercises regularly.  Family Hx: reviewed and unchanged   Review of Systems  Constitutional:  Negative for fever.  HENT:  Positive for congestion Laura Meyers), ear pain and sore throat.        (+) Hoarseness  Eyes:  Negative for pain, discharge and redness.  Respiratory:  Positive for cough and sputum production (thick, yellow).   Skin:  Positive for itching (Right Ear).  All other systems reviewed and are negative.    Objective:  Vitals: BP 120/80   Pulse 83   Temp 99.9 F (37.7 C)   Ht 5\' 8"  (1.727 m)   Wt 166 lb (75.3  kg)   SpO2 97%   BMI 25.24 kg/m   Physical Exam Vitals and nursing note reviewed.  Constitutional:      General: She is not in acute distress.    Appearance: Normal appearance. She is not toxic-appearing.  HENT:     Head: Normocephalic and atraumatic.     Right Ear: Hearing, ear canal and external ear normal. Tympanic membrane is injected (full). Tympanic membrane is not erythematous.     Left Ear: Hearing, tympanic membrane, ear canal and external ear normal.  Pulmonary:     Effort: Pulmonary effort is normal.     Breath sounds: Wheezing (mild) present.  Skin:    General: Skin is warm and dry.  Neurological:     Mental Status: She is alert and oriented to person, place, and time. Mental status is at baseline.  Psychiatric:        Mood and Affect: Mood normal.        Behavior: Behavior normal.        Thought Content: Thought content normal.        Judgment: Judgment normal.     Results:  Studies Obtained And Personally Reviewed By Me: Labs:     Component Value Date/Time   NA 140 08/08/2022 0912   K 4.6 08/08/2022 0912  CL 105 08/08/2022 0912   CO2 29 08/08/2022 0912   GLUCOSE 94 08/08/2022 0912   BUN 15 08/08/2022 0912   CREATININE 0.78 08/08/2022 0912   CALCIUM 9.6 08/08/2022 0912   PROT 6.9 08/08/2022 0912   ALBUMIN 3.8 05/06/2016 1023   AST 23 08/08/2022 0912   ALT 15 08/08/2022 0912   ALKPHOS 72 05/06/2016 1023   BILITOT 0.6 08/08/2022 0912   GFRNONAA 85 07/24/2020 0942   GFRAA 99 07/24/2020 0942    Lab Results  Component Value Date   WBC 5.0 08/08/2022   HGB 12.7 08/08/2022   HCT 40.4 08/08/2022   MCV 76.2 (L) 08/08/2022   PLT 235 08/08/2022   Lab Results  Component Value Date   CHOL 204 (H) 08/08/2022   HDL 90 08/08/2022   LDLCALC 100 (H) 08/08/2022   TRIG 59 08/08/2022   CHOLHDL 2.3 08/08/2022    Lab Results  Component Value Date   TSH 2.56 08/08/2022   Assessment & Plan:   Acute Lower Respiratory Infection: Given 80 mg Depo-Medrol IM  today. Sending in 250 mg Azithromycin - take 2 tablets on Day 1 and 1 tablet on Days 2-5, Hycodan syrup for cough - take 1 teaspoon every 8 hours as needed, and 150 mg Diflucan in case you develop Candida vaginitis.  Stay well rested, well hydrated, and well nourished. Walk around some to prevent atelectasis. Contact us if symptoms worsen/persist despite treatment.      I,Laura Meyers,acting as a Neurosurgeon for Laura Mackintosh, MD.,have documented all relevant documentation on the behalf of Laura Mackintosh, MD,as directed by  Laura Mackintosh, MD while in the presence of Laura Mackintosh, MD.   I, Laura Mackintosh, MD, have reviewed all documentation for this visit. The documentation on 05/17/23 for the exam, diagnosis, procedures, and orders are all accurate and complete.

## 2023-05-17 NOTE — Patient Instructions (Addendum)
 We are sorry you are not feeling well.  You have been diagnosed with a lower respiratory infection. We have prescribed Zithromax Z-PAK to take 2 tabs day 1 followed by 1 tab days 2 through 5.  You were given Depo-Medrol 80 mg IM in the office today.  Please take Hycodan sparingly 1 teaspoon every 6 hours as needed for cough.  May take Diflucan if needed for Candida vaginitis while on antibiotics.  Call if not better in 7 days or sooner if worse.

## 2023-07-06 ENCOUNTER — Other Ambulatory Visit: Payer: Self-pay | Admitting: Internal Medicine

## 2023-08-10 ENCOUNTER — Other Ambulatory Visit: Payer: BC Managed Care – PPO

## 2023-08-11 ENCOUNTER — Encounter: Payer: BC Managed Care – PPO | Admitting: Internal Medicine

## 2023-08-15 NOTE — Progress Notes (Signed)
 Welcome to [facility] Visit   Patient Care Team: Fate Caster, Jaynie Meyers, MD as PCP - General (Internal Medicine)  Visit Date: 08/18/23   Chief Complaint  Patient presents with   Welcome to Medicare    Annual Exam   Subjective:  Patient: Laura Meyers, Female DOB: 16-Apr-1955, 68 y.o. MRN: [id number]  Laura Meyers is a 68 y.o. Female who presents today for her Welcome to [facility] Visit. Patient has Fibrocystic Breast Disease; Nocturnal Leg Cramps; Vitamin D  Deficiency; and Functional Constipation.  Says that she has noticed herself becoming bloated after eating things she normally enjoys w/o issue, such as granola, almonds, and twizzlers. Denies hx of lactose intolerance. Likely has irritable bowel syndrome.  History of Vitamin-D Deficiency treated with Vitamin-D 50,000 units weekly.  Elevated Cholesterol/ LDL on 08/17/2023 Lipid Panel, compared to 07/2022: Cholesterol 231, elevated from 204; LDL 109, elevated from 100 while HDL 104 and Triglycerides 90.   Low MCV on 08/17/2023 CBC, compared to 07/2022: RBC 5.63, elevated from 6.30; MCV 77.1, elevated from 76.2; MCH 23.4, decreased from 24.0; MCHC 30.4, decreased from 31.4; otherwise WNL. 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   Labs 08/17/2023 CMP: WNL  TSH: 2.71  S/p Hysterectomy w/o Oophorectomy for Fibroids in 2002; PAP Smear 08/09/2022  normal.  History of Benign Right Breast Biopsy in 2002; due for Mammogram; last completed 08/05/2022  normal.  Due for Colonoscopy 07/19/2021  removed 5 total polyps - four 3-15 mm sessile polyps from transverse colon, ascending colon, and cecum and one 4 mm sessile polyp from sigmoid colon; Non-bleeding Internal Hemorrhoids on retroflexion.  Bone Density due.   Vaccine Counseling: Due for Covid-19, Shingles 1/2, and Tdap; UTD on Flu and Tdap. Past Medical History:  Diagnosis Date   Fibrocystic  breast disease    Functional constipation    Medical/Surgical History Narrative:   Allergic/Intolerant to: No Known Allergies  1995 - Left Knee Subluxation, Chondromalacia Patella diagnosed via Arthroscopy done by Dr. Jinger Mount in July.   Other - Surghx of: Hammertoe Repair, Left Foot 2nd & 5th Toe, Right Foot 5th Toe Family History  Problem Relation Age of Onset   Cancer Mother    Breast cancer Mother        in 40's   COPD Father    Breast cancer Sister 81   Colon cancer Neg Hx    Esophageal cancer Neg Hx    Stomach cancer Neg Hx    Colon polyps Neg Hx    Rectal cancer Neg Hx    Family History Narrative: No Family History of GI Cancers or Colon Polyps Father, deceased in his 73s due to COPD  Mother, deceased in her 56s due to Breast Cancer w/ hx of Hypertension 2 Brothers healthy as far as is known 2 Sisters - w/ hx of Breast Cancer (onset aged 34) and Hypertension Social History   Social History Narrative   Divorced. No children. Non-smoker or drinker. 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 Currently works part-time at [employer], a Education officer, community.   Review of Systems  Constitutional:  Negative for chills, fever, malaise/fatigue and weight loss.  HENT:  Negative for hearing loss, sinus pain and sore throat.   Respiratory:  Negative for cough, hemoptysis and shortness of breath.  Cardiovascular:  Negative for chest pain, palpitations, leg swelling and PND.  Gastrointestinal:  Negative for abdominal pain, constipation, diarrhea, heartburn, nausea and vomiting.       (+) Bloating  Genitourinary:  Negative for dysuria, frequency and urgency.  Musculoskeletal:  Negative for back pain, myalgias and neck pain.  Skin:  Negative for itching and rash.  Neurological:  Negative for dizziness, tingling, seizures and headaches.  Endo/Heme/Allergies:   Negative for polydipsia.  Psychiatric/Behavioral:  Negative for depression. The patient is not nervous/anxious.     Objective:  Vitals: BP 100/60   Pulse 71   Ht 5\' 8"  (1.727 m)   Wt 170 lb (77.1 kg)   SpO2 99%   BMI 25.85 kg/m  Physical Exam Vitals and nursing note reviewed.  Constitutional:      General: She is not in acute distress.    Appearance: Normal appearance. She is not ill-appearing or toxic-appearing.  HENT:     Head: Normocephalic and atraumatic.     Right Ear: Hearing, tympanic membrane, ear canal and external ear normal.     Left Ear: Hearing, tympanic membrane, ear canal and external ear normal.     Mouth/Throat:     Pharynx: Oropharynx is clear.  Eyes:     Extraocular Movements: Extraocular movements intact.     Pupils: Pupils are equal, round, and reactive to light.  Neck:     Thyroid: No thyroid mass, thyromegaly or thyroid tenderness.     Vascular: No carotid bruit.  Cardiovascular:     Rate and Rhythm: Normal rate and regular rhythm. No extrasystoles are present.    Pulses:          Dorsalis pedis pulses are 2+ on the right side and 2+ on the left side.     Heart sounds: Normal heart sounds. No murmur heard.    No friction rub. No gallop.  Pulmonary:     Effort: Pulmonary effort is normal.     Breath sounds: Normal breath sounds. No decreased breath sounds, wheezing, rhonchi or rales.  Chest:     Chest wall: No mass.  Abdominal:     Palpations: Abdomen is soft. There is no hepatomegaly, splenomegaly or mass.     Tenderness: There is no abdominal tenderness.     Hernia: No hernia is present.  Genitourinary:    Adnexa: Right adnexa normal and left adnexa normal.     Comments: NFEG. Bimanual PAP normal Musculoskeletal:     Cervical back: Normal range of motion.     Right lower leg: No edema.     Left lower leg: No edema.  Lymphadenopathy:     Cervical: No cervical adenopathy.     Upper Body:     Right upper body: No supraclavicular adenopathy.      Left upper body: No supraclavicular adenopathy.  Skin:    General: Skin is warm and dry.  Neurological:     General: No focal deficit present.     Mental Status: She is alert and oriented to person, place, and time. Mental status is at baseline.     Sensory: Sensation is intact.     Motor: Motor function is intact. No weakness.     Deep Tendon Reflexes: Reflexes are normal and symmetric.  Psychiatric:        Attention and Perception: Attention normal.        Mood and Affect: Mood normal.        Speech: Speech normal.  Behavior: Behavior normal.        Thought Content: Thought content normal.        Cognition and Memory: Cognition normal.        Judgment: Judgment normal.    Most Recent Functional Status Assessment:    08/18/2023   11:10 AM  In your present state of health, do you have any difficulty performing the following activities:  Hearing? 0  Vision? 0  Difficulty concentrating or making decisions? 0  Walking or climbing stairs? 0  Dressing or bathing? 0  Doing errands, shopping? 0  Preparing Food and eating ? N  Using the Toilet? N  In the past six months, have you accidently leaked urine? N  Do you have problems with loss of bowel control? N  Managing your Medications? N  Managing your Finances? N  Housekeeping or managing your Housekeeping? N   Most Recent Fall Risk Assessment:    08/18/2023   11:10 AM  Fall Risk   Falls in the past year? 0  Number falls in past yr: 0  Injury with Fall? 0  Risk for fall due to : No Fall Risks  Follow up Falls prevention discussed;Education provided;Falls evaluation completed   Most Recent Depression Screenings:    08/18/2023   11:09 AM 08/09/2022   10:07 AM  PHQ 2/9 Scores  PHQ - 2 Score 0 0   Most Recent Cognitive Screening:    08/18/2023   11:11 AM  6CIT Screen  What Year? 0 points  What month? 0 points  What time? 0 points  Count back from 20 0 points  Months in reverse 0 points  Repeat phrase 0 points   Total Score 0 points   Results:  Studies Obtained And Personally Reviewed By Me:  EKG reviewed today was normal  PAP Smear 08/09/2022  normal.   Mammogram 08/05/2022  normal.  Colonoscopy 07/19/2021  removed 5 total polyps - four 3-15 mm sessile polyps from transverse colon, ascending colon, and cecum and one 4 mm sessile polyp from sigmoid colon; Non-bleeding Internal Hemorrhoids on retroflexion.  Labs:     Component Value Date/Time   NA 137 08/17/2023 0915   K 4.5 08/17/2023 0915   CL 102 08/17/2023 0915   CO2 24 08/17/2023 0915   GLUCOSE 89 08/17/2023 0915   BUN 9 08/17/2023 0915   CREATININE 0.76 08/17/2023 0915   CALCIUM 9.8 08/17/2023 0915   PROT 7.7 08/17/2023 0915   ALBUMIN 3.8 05/06/2016 1023   AST 21 08/17/2023 0915   ALT 14 08/17/2023 0915   ALKPHOS 72 05/06/2016 1023   BILITOT 0.7 08/17/2023 0915   GFRNONAA 85 07/24/2020 0942   GFRAA 99 07/24/2020 0942    Lab Results  Component Value Date   WBC 5.0 08/17/2023   HGB 13.2 08/17/2023   HCT 43.4 08/17/2023   MCV 77.1 (L) 08/17/2023   PLT 234 08/17/2023   Lab Results  Component Value Date   CHOL 231 (H) 08/17/2023   HDL 104 08/17/2023   LDLCALC 109 (H) 08/17/2023   TRIG 90 08/17/2023   CHOLHDL 2.2 08/17/2023   Lab Results  Component Value Date   TSH 2.71 08/17/2023    Assessment & Plan:   Orders Placed This Encounter  Procedures   POCT URINALYSIS DIP (CLINITEK)   EKG 12-Lead   Meds ordered this encounter  Medications   hyoscyamine (LEVSIN/SL) 0.125 MG SL tablet    Sig: Take One tab sublingual as needed one half hour before meals  for irritable bowel syndrome.    Dispense:  60 tablet    Refill:  1  Other Labs Reviewed today: CMP: WNL  TSH: 2.71  Abdominal Bloating after eating things she normally enjoys w/o issue, such as granola, almonds, and twizzlers. Denies hx of lactose intolerance. Likely has irritable bowel syndrome. Sending in Levsin SL to take as needed.  Vitamin-D Deficiency  treated with Vitamin-D 50,000 units weekly.  High HDL cholesterol: on 08/17/2023 Lipid Panel, compared to 07/2022: Cholesterol 231, elevated from 204; LDL 109, elevated from 100 while HDL 104 and Triglycerides 90.   Low MCV on 08/17/2023 CBC, compared to 07/2022: RBC 5.63, elevated from 6.30; MCV 77.1, elevated from 76.2; MCH 23.4, decreased from 24.0; MCHC 30.4, decreased from 31.4; otherwise WNL. 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   S/p Hysterectomy w/o Oophorectomy for Fibroids in 2002; PAP Smear 08/09/2022  normal. Bimanual pap done today was normal.   History of Benign Right Breast Biopsy in 2002; due for Mammogram; last completed 08/05/2022  normal.  Due for Colonoscopy 07/19/2021  removed 5 total polyps - four 3-15 mm sessile polyps from transverse colon, ascending colon, and cecum and one 4 mm sessile polyp from sigmoid colon; Non-bleeding Internal Hemorrhoids on retroflexion.  Bone Density due.   Vaccine Counseling: Due for Covid-19, Shingles 1/2, and Tdap; UTD on Flu and Tdap.Discussed with pt.   Annual wellness visit done today including the all of the following: Reviewed patient's Family Medical History Reviewed and updated list of patient's medical providers Assessment of cognitive impairment was done Assessed patient's functional ability Established a written schedule for health screening services Health Risk Assessent Completed and Reviewed  Discussed health benefits of physical activity, and encouraged her to engage in regular exercise appropriate for her age and condition.    I,Emily Lagle,acting as a Neurosurgeon for Sylvan Evener, MD.,have documented all relevant documentation on the behalf of Sylvan Evener, MD,as directed by  Sylvan Evener, MD while in the presence of Sylvan Evener, MD.   I, Sylvan Evener, MD, have reviewed all documentation for this visit. The documentation on  08/18/23 for the exam, diagnosis, procedures, and orders are all accurate and complete.

## 2023-08-17 ENCOUNTER — Other Ambulatory Visit: Payer: BC Managed Care – PPO

## 2023-08-17 DIAGNOSIS — E785 Hyperlipidemia, unspecified: Secondary | ICD-10-CM

## 2023-08-17 DIAGNOSIS — Z0000 Encounter for general adult medical examination without abnormal findings: Secondary | ICD-10-CM

## 2023-08-17 DIAGNOSIS — Z1329 Encounter for screening for other suspected endocrine disorder: Secondary | ICD-10-CM

## 2023-08-18 ENCOUNTER — Encounter: Payer: Self-pay | Admitting: Internal Medicine

## 2023-08-18 ENCOUNTER — Ambulatory Visit: Payer: Self-pay | Admitting: Internal Medicine

## 2023-08-18 ENCOUNTER — Ambulatory Visit (INDEPENDENT_AMBULATORY_CARE_PROVIDER_SITE_OTHER): Payer: BC Managed Care – PPO | Admitting: Internal Medicine

## 2023-08-18 VITALS — BP 100/60 | HR 71 | Ht 68.0 in | Wt 170.0 lb

## 2023-08-18 DIAGNOSIS — R7989 Other specified abnormal findings of blood chemistry: Secondary | ICD-10-CM | POA: Diagnosis not present

## 2023-08-18 DIAGNOSIS — K589 Irritable bowel syndrome without diarrhea: Secondary | ICD-10-CM

## 2023-08-18 DIAGNOSIS — Z8639 Personal history of other endocrine, nutritional and metabolic disease: Secondary | ICD-10-CM

## 2023-08-18 DIAGNOSIS — R9230 Dense breasts, unspecified: Secondary | ICD-10-CM

## 2023-08-18 DIAGNOSIS — Z13820 Encounter for screening for osteoporosis: Secondary | ICD-10-CM

## 2023-08-18 DIAGNOSIS — Z0000 Encounter for general adult medical examination without abnormal findings: Secondary | ICD-10-CM | POA: Diagnosis not present

## 2023-08-18 DIAGNOSIS — D563 Thalassemia minor: Secondary | ICD-10-CM | POA: Diagnosis not present

## 2023-08-18 DIAGNOSIS — R921 Mammographic calcification found on diagnostic imaging of breast: Secondary | ICD-10-CM

## 2023-08-18 DIAGNOSIS — Z23 Encounter for immunization: Secondary | ICD-10-CM

## 2023-08-18 LAB — CBC WITH DIFFERENTIAL/PLATELET
Absolute Lymphocytes: 1210 {cells}/uL (ref 850–3900)
Absolute Monocytes: 315 {cells}/uL (ref 200–950)
Basophils Absolute: 50 {cells}/uL (ref 0–200)
Basophils Relative: 1 %
Eosinophils Absolute: 50 {cells}/uL (ref 15–500)
Eosinophils Relative: 1 %
HCT: 43.4 % (ref 35.0–45.0)
Hemoglobin: 13.2 g/dL (ref 11.7–15.5)
MCH: 23.4 pg — ABNORMAL LOW (ref 27.0–33.0)
MCHC: 30.4 g/dL — ABNORMAL LOW (ref 32.0–36.0)
MCV: 77.1 fL — ABNORMAL LOW (ref 80.0–100.0)
MPV: 11 fL (ref 7.5–12.5)
Monocytes Relative: 6.3 %
Neutro Abs: 3375 {cells}/uL (ref 1500–7800)
Neutrophils Relative %: 67.5 %
Platelets: 234 10*3/uL (ref 140–400)
RBC: 5.63 10*6/uL — ABNORMAL HIGH (ref 3.80–5.10)
RDW: 14.4 % (ref 11.0–15.0)
Total Lymphocyte: 24.2 %
WBC: 5 10*3/uL (ref 3.8–10.8)

## 2023-08-18 LAB — COMPLETE METABOLIC PANEL WITHOUT GFR
AG Ratio: 1.5 (calc) (ref 1.0–2.5)
ALT: 14 U/L (ref 6–29)
AST: 21 U/L (ref 10–35)
Albumin: 4.6 g/dL (ref 3.6–5.1)
Alkaline phosphatase (APISO): 86 U/L (ref 37–153)
BUN: 9 mg/dL (ref 7–25)
CO2: 24 mmol/L (ref 20–32)
Calcium: 9.8 mg/dL (ref 8.6–10.4)
Chloride: 102 mmol/L (ref 98–110)
Creat: 0.76 mg/dL (ref 0.50–1.05)
Globulin: 3.1 g/dL (ref 1.9–3.7)
Glucose, Bld: 89 mg/dL (ref 65–99)
Potassium: 4.5 mmol/L (ref 3.5–5.3)
Sodium: 137 mmol/L (ref 135–146)
Total Bilirubin: 0.7 mg/dL (ref 0.2–1.2)
Total Protein: 7.7 g/dL (ref 6.1–8.1)

## 2023-08-18 LAB — TSH: TSH: 2.71 m[IU]/L (ref 0.40–4.50)

## 2023-08-18 LAB — POCT URINALYSIS DIP (CLINITEK)
Bilirubin, UA: NEGATIVE
Blood, UA: NEGATIVE
Glucose, UA: NEGATIVE mg/dL
Ketones, POC UA: NEGATIVE mg/dL
Leukocytes, UA: NEGATIVE
Nitrite, UA: NEGATIVE
POC PROTEIN,UA: NEGATIVE
Spec Grav, UA: 1.01 (ref 1.010–1.025)
Urobilinogen, UA: 0.2 U/dL
pH, UA: 6.5 (ref 5.0–8.0)

## 2023-08-18 LAB — LIPID PANEL
Cholesterol: 231 mg/dL — ABNORMAL HIGH (ref <200)
HDL: 104 mg/dL (ref >=50)
LDL Cholesterol (Calc): 109 mg/dL — ABNORMAL HIGH
Non-HDL Cholesterol (Calc): 127 mg/dL (ref <130)
Total CHOL/HDL Ratio: 2.2 (calc) (ref <5.0)
Triglycerides: 90 mg/dL (ref <150)

## 2023-08-18 MED ORDER — HYOSCYAMINE SULFATE 0.125 MG SL SUBL: SUBLINGUAL_TABLET | SUBLINGUAL | 1 refills | Status: AC

## 2023-11-06 ENCOUNTER — Telehealth: Payer: Self-pay | Admitting: Internal Medicine

## 2023-11-22 ENCOUNTER — Ambulatory Visit

## 2023-12-06 ENCOUNTER — Ambulatory Visit

## 2023-12-07 ENCOUNTER — Ambulatory Visit

## 2023-12-07 ENCOUNTER — Encounter: Payer: Self-pay | Admitting: Internal Medicine

## 2023-12-07 ENCOUNTER — Ambulatory Visit (INDEPENDENT_AMBULATORY_CARE_PROVIDER_SITE_OTHER): Admitting: Internal Medicine

## 2023-12-07 VITALS — BP 100/60 | HR 71 | Ht 68.0 in | Wt 170.0 lb

## 2023-12-07 DIAGNOSIS — Z0000 Encounter for general adult medical examination without abnormal findings: Secondary | ICD-10-CM

## 2023-12-07 NOTE — Patient Instructions (Signed)
 Next appointment: Follow up in one year for your annual wellness visit    Preventive Care 65 Years and Older, Female Preventive care refers to lifestyle choices and visits with your health care provider that can promote health and wellness. What does preventive care include? A yearly physical exam. This is also called an annual well check. Dental exams once or twice a year. Routine eye exams. Ask your health care provider how often you should have your eyes checked. Personal lifestyle choices, including: Daily care of your teeth and gums. Regular physical activity. Eating a healthy diet. Avoiding tobacco and drug use. Limiting alcohol use. Practicing safe sex. Taking low-dose aspirin every day. Taking vitamin and mineral supplements as recommended by your health care provider. What happens during an annual well check? The services and screenings done by your health care provider during your annual well check will depend on your age, overall health, lifestyle risk factors, and family history of disease. Counseling  Your health care provider may ask you questions about your: Alcohol use. Tobacco use. Drug use. Emotional well-being. Home and relationship well-being. Sexual activity. Eating habits. History of falls. Memory and ability to understand (cognition). Work and work Astronomer. Reproductive health. Screening  You may have the following tests or measurements: Height, weight, and BMI. Blood pressure. Lipid and cholesterol levels. These may be checked every 5 years, or more frequently if you are over 17 years old. Skin check. Lung cancer screening. You may have this screening every year starting at age 19 if you have a 30-pack-year history of smoking and currently smoke or have quit within the past 15 years. Fecal occult blood test (FOBT) of the stool. You may have this test every year starting at age 31. Flexible sigmoidoscopy or colonoscopy. You may have a  sigmoidoscopy every 5 years or a colonoscopy every 10 years starting at age 58. Hepatitis C blood test. Hepatitis B blood test. Sexually transmitted disease (STD) testing. Diabetes screening. This is done by checking your blood sugar (glucose) after you have not eaten for a while (fasting). You may have this done every 1-3 years. Bone density scan. This is done to screen for osteoporosis. You may have this done starting at age 87. Mammogram. This may be done every 1-2 years. Talk to your health care provider about how often you should have regular mammograms. Talk with your health care provider about your test results, treatment options, and if necessary, the need for more tests. Vaccines  Your health care provider may recommend certain vaccines, such as: Influenza vaccine. This is recommended every year. Tetanus, diphtheria, and acellular pertussis (Tdap, Td) vaccine. You may need a Td booster every 10 years. Zoster vaccine. You may need this after age 52. Pneumococcal 13-valent conjugate (PCV13) vaccine. One dose is recommended after age 18. Pneumococcal polysaccharide (PPSV23) vaccine. One dose is recommended after age 25. Talk to your health care provider about which screenings and vaccines you need and how often you need them. This information is not intended to replace advice given to you by your health care provider. Make sure you discuss any questions you have with your health care provider. Document Released: 04/03/2015 Document Revised: 11/25/2015 Document Reviewed: 01/06/2015 Elsevier Interactive Patient Education  2017 [employer].  Fall Prevention in the Home Falls can cause injuries. They can happen to people of all ages. There are many things you can do to make your home safe and to help prevent falls. What can I do on the outside of  my home? Regularly fix the edges of walkways and driveways and fix any cracks. Remove anything that might make you trip as you walk through a  door, such as a raised step or threshold. Trim any bushes or trees on the path to your home. Use bright outdoor lighting. Clear any walking paths of anything that might make someone trip, such as rocks or tools. Regularly check to see if handrails are loose or broken. Make sure that both sides of any steps have handrails. Any raised decks and porches should have guardrails on the edges. Have any leaves, snow, or ice cleared regularly. Use sand or salt on walking paths during winter. Clean up any spills in your garage right away. This includes oil or grease spills. What can I do in the bathroom? Use night lights. Install grab bars by the toilet and in the tub and shower. Do not use towel bars as grab bars. Use non-skid mats or decals in the tub or shower. If you need to sit down in the shower, use a plastic, non-slip stool. Keep the floor dry. Clean up any water that spills on the floor as soon as it happens. Remove soap buildup in the tub or shower regularly. Attach bath mats securely with double-sided non-slip rug tape. Do not have throw rugs and other things on the floor that can make you trip. What can I do in the bedroom? Use night lights. Make sure that you have a light by your bed that is easy to reach. Do not use any sheets or blankets that are too big for your bed. They should not hang down onto the floor. Have a firm chair that has side arms. You can use this for support while you get dressed. Do not have throw rugs and other things on the floor that can make you trip. What can I do in the kitchen? Clean up any spills right away. Avoid walking on wet floors. Keep items that you use a lot in easy-to-reach places. If you need to reach something above you, use a strong step stool that has a grab bar. Keep electrical cords out of the way. Do not use floor polish or wax that makes floors slippery. If you must use wax, use non-skid floor wax. Do not have throw rugs and other things  on the floor that can make you trip. What can I do with my stairs? Do not leave any items on the stairs. Make sure that there are handrails on both sides of the stairs and use them. Fix handrails that are broken or loose. Make sure that handrails are as long as the stairways. Check any carpeting to make sure that it is firmly attached to the stairs. Fix any carpet that is loose or worn. Avoid having throw rugs at the top or bottom of the stairs. If you do have throw rugs, attach them to the floor with carpet tape. Make sure that you have a light switch at the top of the stairs and the bottom of the stairs. If you do not have them, ask someone to add them for you. What else can I do to help prevent falls? Wear shoes that: Do not have high heels. Have rubber bottoms. Are comfortable and fit you well. Are closed at the toe. Do not wear sandals. If you use a stepladder: Make sure that it is fully opened. Do not climb a closed stepladder. Make sure that both sides of the stepladder are locked into place. Ask  someone to hold it for you, if possible. Clearly mark and make sure that you can see: Any grab bars or handrails. First and last steps. Where the edge of each step is. Use tools that help you move around (mobility aids) if they are needed. These include: Canes. Walkers. Scooters. Crutches. Turn on the lights when you go into a dark area. Replace any light bulbs as soon as they burn out. Set up your furniture so you have a clear path. Avoid moving your furniture around. If any of your floors are uneven, fix them. If there are any pets around you, be aware of where they are. Review your medicines with your doctor. Some medicines can make you feel dizzy. This can increase your chance of falling. Ask your doctor what other things that you can do to help prevent falls. This information is not intended to replace advice given to you by your health care provider. Make sure you discuss any  questions you have with your health care provider. Document Released: 01/01/2009 Document Revised: 08/13/2015 Document Reviewed: 04/11/2014 Elsevier Interactive Patient Education  2017 [employer].

## 2023-12-07 NOTE — Progress Notes (Signed)
 Subjective:   Laura Meyers is a 68 y.o. Female who presents for Welcome to [facility] Annual Wellness Visit.  Visit Complete: In person  Patient Medicare AWV questionnaire was completed by the patient on 12/07/2023; I have confirmed that all information answered by patient is correct and no changes since this date.  Cardiac Risk Factors include: advanced age (>29men, >81 women)     Objective:    Today's Vitals   12/07/23 1603 12/07/23 1605  BP: 100/60   Pulse: 71   SpO2: 98%   Weight: 170 lb (77.1 kg)   Height: 5' 8 (1.727 m)   PainSc: 0-No pain 0-No pain   Body mass index is 25.85 kg/m.     12/07/2023    3:41 PM  Advanced Directives  Does Patient Have a Medical Advance Directive? No    Current Medications (verified) Outpatient Encounter Medications as of 12/07/2023  Medication Sig   HYDROcodone  bit-homatropine (HYCODAN) 5-1.5 MG/5ML syrup Take 5 mLs by mouth every 8 (eight) hours as needed for cough.   hyoscyamine  (LEVSIN /SL) 0.125 MG SL tablet Take One tab sublingual as needed one half hour before meals for irritable bowel syndrome.   Multiple Vitamin (MULTIVITAMIN) tablet Take 1 tablet by mouth daily.   triamcinolone  cream (KENALOG ) 0.1 % Apply 1 Application topically 2 (two) times daily.   TURMERIC PO Take by mouth.   UNABLE TO FIND Med Name: Herbal Life Total control   Vitamin D , Ergocalciferol , (DRISDOL ) 1.25 MG (50000 UNIT) CAPS capsule TAKE 1 CAPSULE BY MOUTH EVERY 7 DAYS   No facility-administered encounter medications on file as of 12/07/2023.    Allergies (verified) Patient has no known allergies.   History: Past Medical History:  Diagnosis Date   Fibrocystic breast disease    Functional constipation    Past Surgical History:  Procedure Laterality Date   ABDOMINAL HYSTERECTOMY     BREAST EXCISIONAL BIOPSY Right    hammer toes     left foot, 2nd toe; 5th toe. right foot, 5th toe   KNEE ARTHROSCOPY     Family History  Problem Relation Age of  Onset   Cancer Mother    Breast cancer Mother        in 33's   COPD Father    Breast cancer Sister 70   Colon cancer Neg Hx    Esophageal cancer Neg Hx    Stomach cancer Neg Hx    Colon polyps Neg Hx    Rectal cancer Neg Hx    Social History   Socioeconomic History   Marital status: Single    Spouse name: Not on file   Number of children: Not on file   Years of education: Not on file   Highest education level: Master's degree (e.g., MA, MS, MEng, MEd, MSW, MBA)  Occupational History   Not on file  Tobacco Use   Smoking status: Never   Smokeless tobacco: Never  Vaping Use   Vaping status: Never Used  Substance and Sexual Activity   Alcohol use: No   Drug use: No   Sexual activity: Not on file  Other Topics Concern   Not on file  Social History Narrative   Divorced. No children. Non-smoker or drinker. 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 Currently works part-time at [employer], a Education officer, community.   Social Drivers of Corporate investment banker Strain: Low  Risk  (12/06/2023)   Overall Financial Resource Strain (CARDIA)    Difficulty of Paying Living Expenses: Not hard at all  Food Insecurity: No Food Insecurity (12/06/2023)   Hunger Vital Sign    Worried About Running Out of Food in the Last Year: Never true    Ran Out of Food in the Last Year: Never true  Transportation Needs: No Transportation Needs (12/06/2023)   PRAPARE - Administrator, Civil Service (Medical): No    Lack of Transportation (Non-Medical): No  Physical Activity: Sufficiently Active (12/06/2023)   Exercise Vital Sign    Days of Exercise per Week: 5 days    Minutes of Exercise per Session: 30 min  Stress: No Stress Concern Present (12/06/2023)   [employer] of Occupational Health - Occupational Stress Questionnaire    Feeling of Stress: Not at all   Social Connections: Moderately Integrated (12/06/2023)   Social Connection and Isolation Panel    Frequency of Communication with Friends and Family: More than three times a week    Frequency of Social Gatherings with Friends and Family: More than three times a week    Attends Religious Services: More than 4 times per year    Active Member of [employer] or Organizations: Yes    Attends Banker Meetings: 1 to 4 times per year    Marital Status: Divorced    Tobacco Counseling Counseling given: Not Answered   Clinical Intake:  Pre-visit preparation completed: Yes  Pain : No/denies pain Pain Score: 0-No pain     BMI - recorded: 25.85 Nutritional Status: BMI 25 -29 Overweight Nutritional Risks: None  How often do you need to have someone help you when you read instructions, pamphlets, or other written materials from your doctor or pharmacy?: 1 - Never  Interpreter Needed?: No  Information entered by :: Kathlynn Porto, CMA   Activities of Daily Living    12/07/2023    4:06 PM 12/06/2023   12:14 PM  In your present state of health, do you have any difficulty performing the following activities:  Hearing? 0 0  Vision? 0 0  Difficulty concentrating or making decisions? 0 0  Walking or climbing stairs? 0 0  Dressing or bathing? 0 0  Doing errands, shopping? 0 0  Preparing Food and eating ? N N  Using the Toilet? N N  In the past six months, have you accidently leaked urine? N N  Do you have problems with loss of bowel control? N N  Managing your Medications? N N  Managing your Finances? N N  Housekeeping or managing your Housekeeping? N N    Patient Care Team: Perri Ronal PARAS, MD as PCP - General (Internal Medicine)  Indicate any recent Medical Services you may have received from other than [facility] providers in the past year (date may be approximate).     Assessment:   This is a routine wellness examination for Laura Meyers.  Hearing/Vision screen Hearing  Screening   500Hz  1000Hz  2000Hz  4000Hz   Right ear Pass Pass Pass Pass  Left ear Pass Pass Pass Pass   Vision Screening   Right eye Left eye Both eyes  Without correction 20/20 20/30 20/20   With correction        Goals Addressed   None    Depression Screen    12/07/2023    3:42 PM 08/18/2023   11:09 AM 08/09/2022   10:07 AM 08/06/2021   10:11 AM 07/31/2020    3:15 PM 06/28/2019  10:07 AM 07/19/2016    4:02 PM  PHQ 2/9 Scores  PHQ - 2 Score 0 0 0 0 0 0 0    Fall Risk    12/07/2023    4:06 PM 12/06/2023   12:14 PM 08/18/2023   11:10 AM 08/17/2023    9:11 PM 08/16/2023    2:51 PM  Fall Risk   Falls in the past year? 0 0 0 0 0  Number falls in past yr: 0  0    Injury with Fall? 0  0    Risk for fall due to : No Fall Risks  No Fall Risks    Follow up Falls prevention discussed;Education provided;Falls evaluation completed  Falls prevention discussed;Education provided;Falls evaluation completed      MEDICARE RISK AT HOME: Medicare Risk at Home Any stairs in or around the home?: Yes If so, are there any without handrails?: No Home free of loose throw rugs in walkways, pet beds, electrical cords, etc?: Yes Adequate lighting in your home to reduce risk of falls?: Yes Life alert?: No Use of a cane, walker or w/c?: No Grab bars in the bathroom?: No Shower chair or bench in shower?: No Elevated toilet seat or a handicapped toilet?: No  TIMED UP AND GO:  Was the test performed? No    Cognitive Function:        12/07/2023    4:06 PM 08/18/2023   11:11 AM  6CIT Screen  What Year? 0 points 0 points  What month? 0 points 0 points  What time? 0 points 0 points  Count back from 20 0 points 0 points  Months in reverse 0 points 0 points  Repeat phrase 0 points 0 points  Total Score 0 points 0 points    Immunizations Immunization History  Administered Date(s) Administered   Moderna Sars-Covid-2 Vaccination 06/07/2019, 07/09/2019, 01/23/2020   Tdap 03/30/2009, 07/31/2020     TDAP status: Up to date  Flu Vaccine status: Due, Education has been provided regarding the importance of this vaccine. Advised may receive this vaccine at local pharmacy or Health Dept. Aware to provide a copy of the vaccination record if obtained from local pharmacy or Health Dept. Verbalized acceptance and understanding.  Pneumococcal vaccine status: Due, Education has been provided regarding the importance of this vaccine. Advised may receive this vaccine at local pharmacy or Health Dept. Aware to provide a copy of the vaccination record if obtained from local pharmacy or Health Dept. Verbalized acceptance and understanding.  Covid-19 vaccine status: Information provided on how to obtain vaccines.   Qualifies for Shingles Vaccine? Yes   Zostavax completed No   Shingrix Completed?: No.    Education has been provided regarding the importance of this vaccine. Patient has been advised to call insurance company to determine out of pocket expense if they have not yet received this vaccine. Advised may also receive vaccine at local pharmacy or Health Dept. Verbalized acceptance and understanding.  Screening Tests Health Maintenance  Topic Date Due   Pneumococcal Vaccine: 50+ Years (1 of 1 - PCV) Never done   Zoster Vaccines- Shingrix (1 of 2) Never done   DEXA SCAN  Never done   Influenza Vaccine  10/20/2023   COVID-19 Vaccine (4 - 2025-26 season) 11/20/2023   Colonoscopy  07/19/2024   Mammogram  08/04/2024   DTaP/Tdap/Td (3 - Td or Tdap) 08/01/2030   HPV VACCINES  Aged Out   Meningococcal B Vaccine  Aged Out   Hepatitis C Screening  Discontinued    Health Maintenance  Health Maintenance Due  Topic Date Due   Pneumococcal Vaccine: 50+ Years (1 of 1 - PCV) Never done   Zoster Vaccines- Shingrix (1 of 2) Never done   DEXA SCAN  Never done   Influenza Vaccine  10/20/2023   COVID-19 Vaccine (4 - 2025-26 season) 11/20/2023    Colorectal cancer screening: Type of screening:  Colonoscopy. Completed 07/19/2021. Repeat every 3 years  Mammogram status: Ordered 12/07/2023. Pt provided with contact info and advised to call to schedule appt.   Bone Density status: Ordered 12/07/2023. Pt provided with contact info and advised to call to schedule appt.  Lung Cancer Screening: (Low Dose CT Chest recommended if Age 2-80 years, 20 pack-year currently smoking OR have quit w/in 15years.) does not qualify.   Additional Screening:  Hepatitis C Screening: does not qualify; Completed   Vision Screening: Recommended annual ophthalmology exams for early detection of glaucoma and other disorders of the eye. Is the patient up to date with their annual eye exam?  Yes  Who is the provider or what is the name of the office in which the patient attends annual eye exams? [facility] [place] If pt is not established with a provider, would they like to be referred to a provider to establish care? No .   Dental Screening: Recommended annual dental exams for proper oral hygiene  Community Resource Referral / Chronic Care Management: CRR required this visit?  No   CCM required this visit?  No     Plan:     I have personally reviewed and noted the following in the patient's chart:   Medical and social history Use of alcohol, tobacco or illicit drugs  Current medications and supplements including opioid prescriptions. Patient is not currently taking opioid prescriptions. Functional ability and status Nutritional status Physical activity Advanced directives List of other physicians Hospitalizations, surgeries, and ER visits in previous 12 months Vitals Screenings to include cognitive, depression, and falls Referrals and appointments  In addition, I have reviewed and discussed with patient certain preventive protocols, quality metrics, and best practice recommendations. A written personalized care plan for preventive services as well as general preventive health  recommendations were provided to patient.     Araceli Zelda, CMA   12/07/2023   After Visit Summary: (In Person-Printed) AVS printed and given to the patient  I, Ronal JINNY Hailstone, MD, have reviewed all documentation for this visit. The documentation on 12/07/2023 for the exam, diagnosis, procedures, and orders are all accurate and complete.   I have reviewed and agree with the above Annual Wellness Visit documentation.  Ronal Norleen Hailstone, MD. Internal Medicine 12/07/2023

## 2023-12-07 NOTE — Progress Notes (Deleted)
 Subjective:   Laura Meyers is a 68 y.o. female who presents for Medicare Annual (Subsequent) preventive examination.  Visit Complete: In person  Patient Medicare AWV questionnaire was completed by the patient on 12/07/2023; I have confirmed that all information answered by patient is correct and no changes since this date.  Cardiac Risk Factors include: advanced age (>68men, >52 women)     Objective:    Today's Vitals   12/07/23 1603 12/07/23 1605  BP: 100/60   Pulse: 71   SpO2: 98%   Weight: 170 lb (77.1 kg)   Height: 5' 8 (1.727 m)   PainSc: 0-No pain 0-No pain   Body mass index is 25.85 kg/m.     12/07/2023    3:41 PM  Advanced Directives  Does Patient Have a Medical Advance Directive? No    Current Medications (verified) Outpatient Encounter Medications as of 12/07/2023  Medication Sig   HYDROcodone  bit-homatropine (HYCODAN) 5-1.5 MG/5ML syrup Take 5 mLs by mouth every 8 (eight) hours as needed for cough.   hyoscyamine  (LEVSIN /SL) 0.125 MG SL tablet Take One tab sublingual as needed one half hour before meals for irritable bowel syndrome.   Multiple Vitamin (MULTIVITAMIN) tablet Take 1 tablet by mouth daily.   triamcinolone  cream (KENALOG ) 0.1 % Apply 1 Application topically 2 (two) times daily.   TURMERIC PO Take by mouth.   UNABLE TO FIND Med Name: Herbal Life Total control   Vitamin D , Ergocalciferol , (DRISDOL ) 1.25 MG (50000 UNIT) CAPS capsule TAKE 1 CAPSULE BY MOUTH EVERY 7 DAYS   No facility-administered encounter medications on file as of 12/07/2023.    Allergies (verified) Patient has no known allergies.   History: Past Medical History:  Diagnosis Date   Fibrocystic breast disease    Functional constipation    Past Surgical History:  Procedure Laterality Date   ABDOMINAL HYSTERECTOMY     BREAST EXCISIONAL BIOPSY Right    hammer toes     left foot, 2nd toe; 5th toe. right foot, 5th toe   KNEE ARTHROSCOPY     Family History  Problem Relation  Age of Onset   Cancer Mother    Breast cancer Mother        in 17's   COPD Father    Breast cancer Sister 40   Colon cancer Neg Hx    Esophageal cancer Neg Hx    Stomach cancer Neg Hx    Colon polyps Neg Hx    Rectal cancer Neg Hx    Social History   Socioeconomic History   Marital status: Single    Spouse name: Not on file   Number of children: Not on file   Years of education: Not on file   Highest education level: Master's degree (e.g., MA, MS, MEng, MEd, MSW, MBA)  Occupational History   Not on file  Tobacco Use   Smoking status: Never   Smokeless tobacco: Never  Vaping Use   Vaping status: Never Used  Substance and Sexual Activity   Alcohol use: No   Drug use: No   Sexual activity: Not on file  Other Topics Concern   Not on file  Social History Narrative   Divorced. No children. Non-smoker or drinker. 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 Currently works part-time at [employer], a Education officer, community.   Social Drivers of Corporate investment banker Strain: Low Risk  (  12/06/2023)   Overall Financial Resource Strain (CARDIA)    Difficulty of Paying Living Expenses: Not hard at all  Food Insecurity: No Food Insecurity (12/06/2023)   Hunger Vital Sign    Worried About Running Out of Food in the Last Year: Never true    Ran Out of Food in the Last Year: Never true  Transportation Needs: No Transportation Needs (12/06/2023)   PRAPARE - Administrator, Civil Service (Medical): No    Lack of Transportation (Non-Medical): No  Physical Activity: Sufficiently Active (12/06/2023)   Exercise Vital Sign    Days of Exercise per Week: 5 days    Minutes of Exercise per Session: 30 min  Stress: No Stress Concern Present (12/06/2023)   [employer] of Occupational Health - Occupational Stress Questionnaire    Feeling of Stress: Not at  all  Social Connections: Moderately Integrated (12/06/2023)   Social Connection and Isolation Panel    Frequency of Communication with Friends and Family: More than three times a week    Frequency of Social Gatherings with Friends and Family: More than three times a week    Attends Religious Services: More than 4 times per year    Active Member of [employer] or Organizations: Yes    Attends Banker Meetings: 1 to 4 times per year    Marital Status: Divorced    Tobacco Counseling Counseling given: Not Answered   Clinical Intake:  Pre-visit preparation completed: Yes  Pain : No/denies pain Pain Score: 0-No pain     BMI - recorded: 25.85 Nutritional Status: BMI 25 -29 Overweight Nutritional Risks: None  How often do you need to have someone help you when you read instructions, pamphlets, or other written materials from your doctor or pharmacy?: 1 - Never  Interpreter Needed?: No  Information entered by :: Kathlynn Porto, CMA   Activities of Daily Living    12/07/2023    4:06 PM 12/06/2023   12:14 PM  In your present state of health, do you have any difficulty performing the following activities:  Hearing? 0 0  Vision? 0 0  Difficulty concentrating or making decisions? 0 0  Walking or climbing stairs? 0 0  Dressing or bathing? 0 0  Doing errands, shopping? 0 0  Preparing Food and eating ? N N  Using the Toilet? N N  In the past six months, have you accidently leaked urine? N N  Do you have problems with loss of bowel control? N N  Managing your Medications? N N  Managing your Finances? N N  Housekeeping or managing your Housekeeping? N N    Patient Care Team: Perri Ronal PARAS, MD as PCP - General (Internal Medicine)  Indicate any recent Medical Services you may have received from other than [facility] providers in the past year (date may be approximate).     Assessment:   This is a routine wellness examination for Laura Meyers.  Hearing/Vision screen Hearing  Screening   500Hz  1000Hz  2000Hz  4000Hz   Right ear Pass Pass Pass Pass  Left ear Pass Pass Pass Pass   Vision Screening   Right eye Left eye Both eyes  Without correction 20/20 20/30 20/20   With correction        Goals Addressed   None    Depression Screen    12/07/2023    3:42 PM 08/18/2023   11:09 AM 08/09/2022   10:07 AM 08/06/2021   10:11 AM 07/31/2020    3:15 PM 06/28/2019  10:07 AM 07/19/2016    4:02 PM  PHQ 2/9 Scores  PHQ - 2 Score 0 0 0 0 0 0 0    Fall Risk    12/07/2023    4:06 PM 12/06/2023   12:14 PM 08/18/2023   11:10 AM 08/17/2023    9:11 PM 08/16/2023    2:51 PM  Fall Risk   Falls in the past year? 0 0 0 0 0  Number falls in past yr: 0  0    Injury with Fall? 0  0    Risk for fall due to : No Fall Risks  No Fall Risks    Follow up Falls prevention discussed;Education provided;Falls evaluation completed  Falls prevention discussed;Education provided;Falls evaluation completed      MEDICARE RISK AT HOME: Medicare Risk at Home Any stairs in or around the home?: Yes If so, are there any without handrails?: No Home free of loose throw rugs in walkways, pet beds, electrical cords, etc?: Yes Adequate lighting in your home to reduce risk of falls?: Yes Life alert?: No Use of a cane, walker or w/c?: No Grab bars in the bathroom?: No Shower chair or bench in shower?: No Elevated toilet seat or a handicapped toilet?: No  TIMED UP AND GO:  Was the test performed?  No    Cognitive Function:        12/07/2023    4:06 PM 08/18/2023   11:11 AM  6CIT Screen  What Year? 0 points 0 points  What month? 0 points 0 points  What time? 0 points 0 points  Count back from 20 0 points 0 points  Months in reverse 0 points 0 points  Repeat phrase 0 points 0 points  Total Score 0 points 0 points    Immunizations Immunization History  Administered Date(s) Administered   Moderna Sars-Covid-2 Vaccination 06/07/2019, 07/09/2019, 01/23/2020   Tdap 03/30/2009, 07/31/2020     TDAP status: Up to date  Flu Vaccine status: Due, Education has been provided regarding the importance of this vaccine. Advised may receive this vaccine at local pharmacy or Health Dept. Aware to provide a copy of the vaccination record if obtained from local pharmacy or Health Dept. Verbalized acceptance and understanding.  Pneumococcal vaccine status: Due, Education has been provided regarding the importance of this vaccine. Advised may receive this vaccine at local pharmacy or Health Dept. Aware to provide a copy of the vaccination record if obtained from local pharmacy or Health Dept. Verbalized acceptance and understanding.  Covid-19 vaccine status: Information provided on how to obtain vaccines.   Qualifies for Shingles Vaccine? Yes   Zostavax completed No   Shingrix Completed?: No.    Education has been provided regarding the importance of this vaccine. Patient has been advised to call insurance company to determine out of pocket expense if they have not yet received this vaccine. Advised may also receive vaccine at local pharmacy or Health Dept. Verbalized acceptance and understanding.  Screening Tests Health Maintenance  Topic Date Due   Pneumococcal Vaccine: 50+ Years (1 of 1 - PCV) Never done   Zoster Vaccines- Shingrix (1 of 2) Never done   DEXA SCAN  Never done   Influenza Vaccine  10/20/2023   COVID-19 Vaccine (4 - 2025-26 season) 11/20/2023   Colonoscopy  07/19/2024   Mammogram  08/04/2024   DTaP/Tdap/Td (3 - Td or Tdap) 08/01/2030   HPV VACCINES  Aged Out   Meningococcal B Vaccine  Aged Out   Hepatitis C Screening  Discontinued    Health Maintenance  Health Maintenance Due  Topic Date Due   Pneumococcal Vaccine: 50+ Years (1 of 1 - PCV) Never done   Zoster Vaccines- Shingrix (1 of 2) Never done   DEXA SCAN  Never done   Influenza Vaccine  10/20/2023   COVID-19 Vaccine (4 - 2025-26 season) 11/20/2023    Colorectal cancer screening: Type of screening:  Colonoscopy. Completed 07/19/2021. Repeat every 3 years  Mammogram status: Completed 08/05/2022. Repeat every year  Bone Density status: Ordered 12/07/2023. Pt provided with contact info and advised to call to schedule appt.  Lung Cancer Screening: (Low Dose CT Chest recommended if Age 64-80 years, 20 pack-year currently smoking OR have quit w/in 15years.) does not qualify.   Additional Screening:  Hepatitis C Screening: does not qualify; Completed   Vision Screening: Recommended annual ophthalmology exams for early detection of glaucoma and other disorders of the eye. Is the patient up to date with their annual eye exam?  Yes  Who is the provider or what is the name of the office in which the patient attends annual eye exams? [facility] [place] If pt is not established with a provider, would they like to be referred to a provider to establish care? No .   Dental Screening: Recommended annual dental exams for proper oral hygiene  Community Resource Referral / Chronic Care Management: CRR required this visit?  No   CCM required this visit?  No     Plan:     I have personally reviewed and noted the following in the patient's chart:   Medical and social history Use of alcohol, tobacco or illicit drugs  Current medications and supplements including opioid prescriptions. Patient is not currently taking opioid prescriptions. Functional ability and status Nutritional status Physical activity Advanced directives List of other physicians Hospitalizations, surgeries, and ER visits in previous 12 months Vitals Screenings to include cognitive, depression, and falls Referrals and appointments  In addition, I have reviewed and discussed with patient certain preventive protocols, quality metrics, and best practice recommendations. A written personalized care plan for preventive services as well as general preventive health recommendations were provided to patient.      Levana Minetti Zelda, CMA   12/07/2023   After Visit Summary: (In Person-Printed) AVS printed and given to the patient

## 2023-12-26 IMAGING — MG MM DIGITAL DIAGNOSTIC UNILAT*R*
3 series · 3 of 3 positions shown · non-contrast
Comparison: Previous exam(s).

CLINICAL DATA: 65-year-old female recalled from screening mammogram
dated 08/06/2021 for right breast calcifications.

EXAM:
DIGITAL DIAGNOSTIC UNILATERAL RIGHT MAMMOGRAM
TECHNIQUE: Right digital diagnostic mammography was performed. Mammographic
images were processed with CAD.

[R ML (1 of 2)]
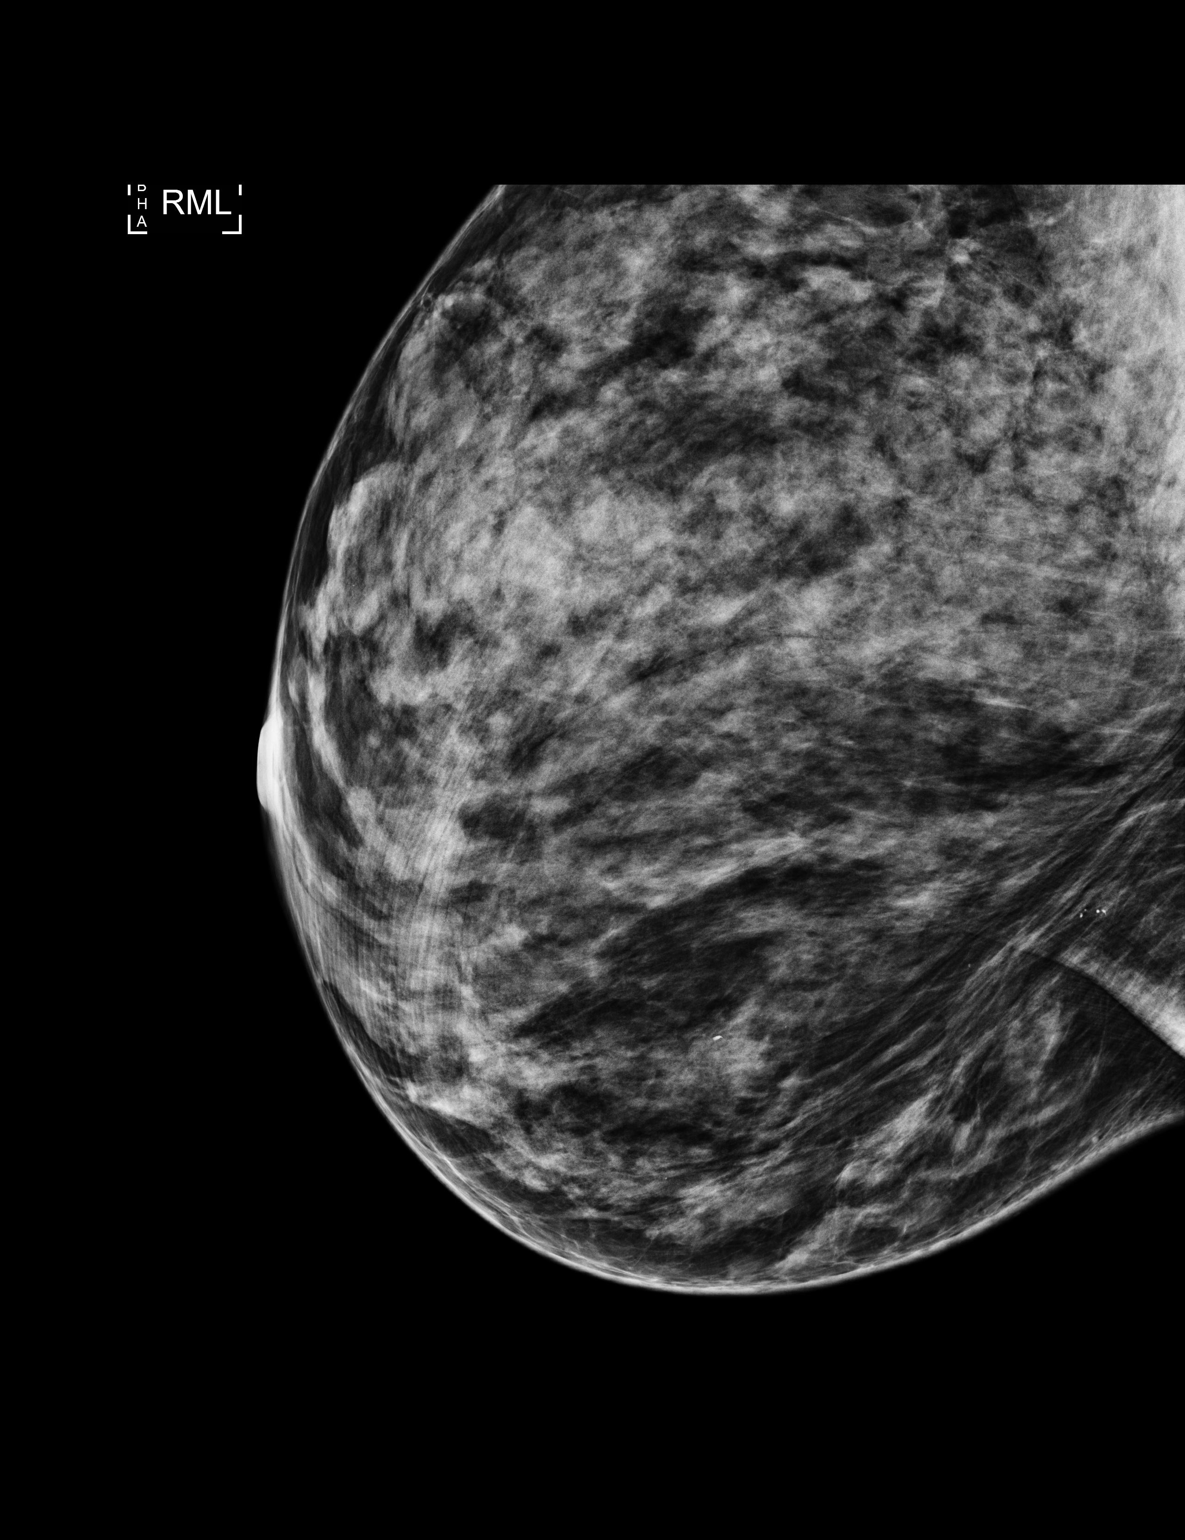

[R CC]
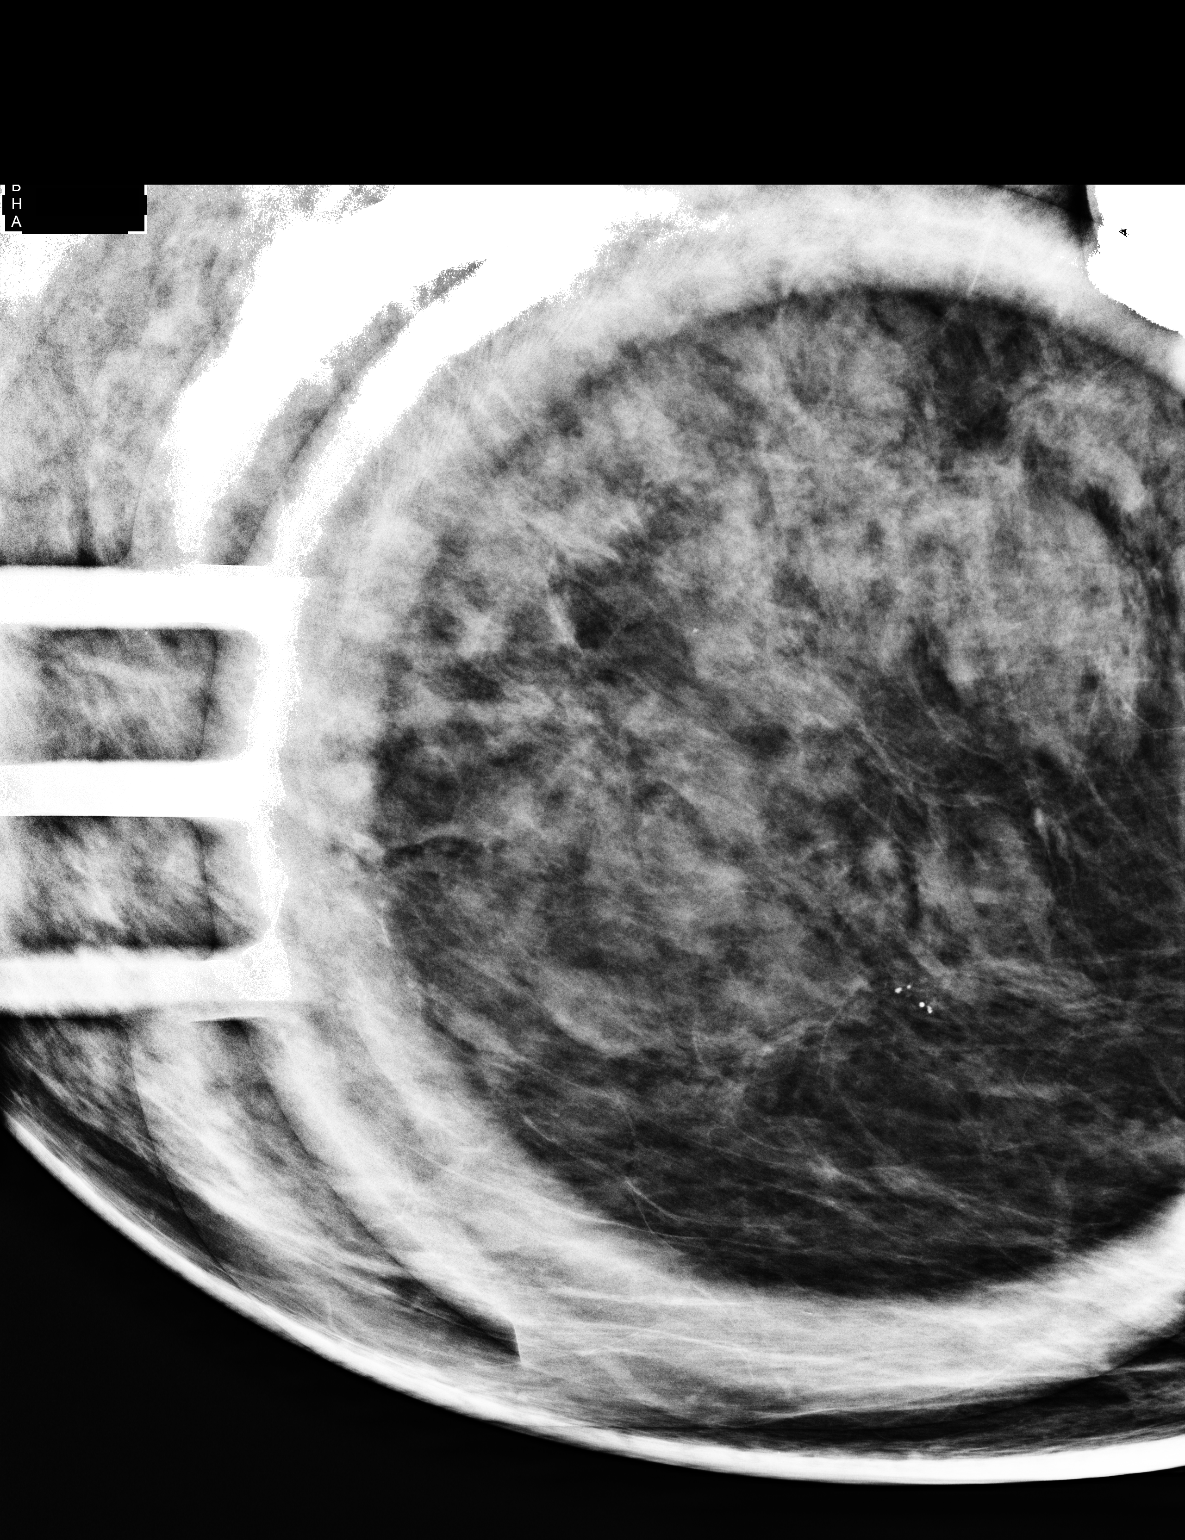

[R ML (2 of 2)]
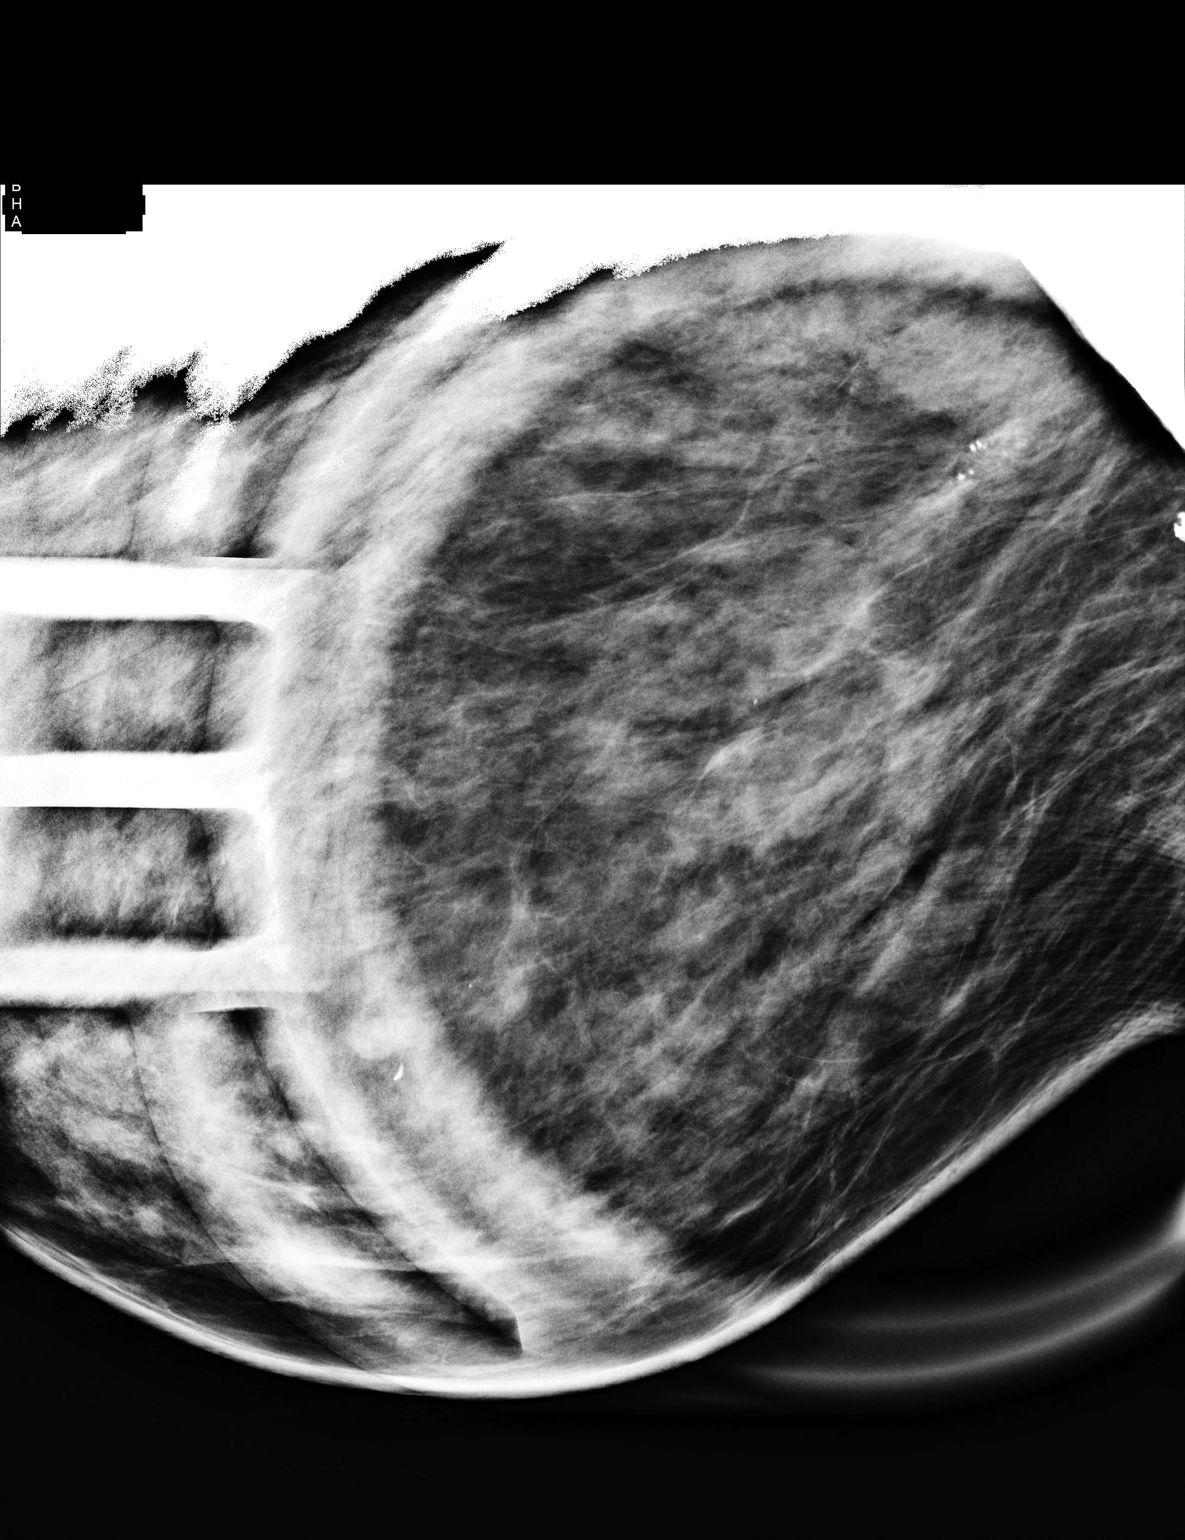

[3 of 3 positions shown; findings below may reference images not displayed]

ACR Breast Density Category d: The breast tissue is extremely dense,
which lowers the sensitivity of mammography.
FINDINGS: There is a 4 mm group round and coarse calcifications in the lower
inner quadrant of the right breast at mid to posterior depth. These
have a probably benign morphology. Other scattered coarse
calcifications noted bilaterally.
IMPRESSION: Probably benign right breast calcifications. Recommend short-term
follow-up.

RECOMMENDATION:
Diagnostic right breast mammogram in 6 months.

I have discussed the findings and recommendations with the patient.
If applicable, a reminder letter will be sent to the patient
regarding the next appointment.

BI-RADS CATEGORY  3: Probably benign.

## 2024-03-05 NOTE — Telephone Encounter (Signed)
 done

## 2024-03-17 ENCOUNTER — Ambulatory Visit: Payer: Self-pay

## 2024-03-18 ENCOUNTER — Telehealth: Payer: Self-pay | Admitting: Internal Medicine

## 2024-03-18 NOTE — Telephone Encounter (Signed)
 Done

## 2024-04-25 ENCOUNTER — Encounter: Payer: Self-pay | Admitting: Internal Medicine

## 2024-04-25 ENCOUNTER — Ambulatory Visit: Admitting: Internal Medicine

## 2024-04-25 VITALS — BP 100/80 | HR 72 | Ht 71.0 in | Wt 183.0 lb

## 2024-04-25 DIAGNOSIS — M79605 Pain in left leg: Secondary | ICD-10-CM

## 2024-04-25 MED ORDER — MELOXICAM 15 MG PO TABS: 15.0000 mg | ORAL_TABLET | Freq: Every day | ORAL | 0 refills | Status: AC

## 2024-04-25 NOTE — Progress Notes (Signed)
 "   Patient Care Team: Perri Ronal PARAS, MD as PCP - General (Internal Medicine)  Visit Date: 04/25/24  Subjective:    Patient ID: Laura Meyers , Female   DOB: 01-Nov-1955, 69 y.o.    MRN: [id number]   69 y.o. Female presents today for Leg pain. Patient has a past medical history of Fibrocystic Breast Disease; Nocturnal Leg Cramps; Vitamin D  Deficiency; and Functional Constipation.  She woke up this morning with a cramp in the calf of her left leg, after 30 minutes it was still sore. She denies causing injury to the area. She is concerned that she might have a clot in her leg. Her leg is still sore and she is limping when she walks around. No cords, pain upon palpation of the left popliteal area, Homans' sign negative.    Past Medical History:  Diagnosis Date   Fibrocystic breast disease    Functional constipation      Family History  Problem Relation Age of Onset   Cancer Mother    Breast cancer Mother        in 32's   COPD Father    Breast cancer Sister 1   Colon cancer Neg Hx    Esophageal cancer Neg Hx    Stomach cancer Neg Hx    Colon polyps Neg Hx    Rectal cancer Neg Hx     Social History   Social History Narrative   Divorced. No children. Non-smoker or drinker. 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 Currently works part-time at [employer], a education officer, community.      Review of Systems  Musculoskeletal:        Leg pain        Objective:   Vitals: BP 100/80   Pulse 72   Ht 5' 11 (1.803 m)   Wt 183 lb (83 kg)   SpO2 99%   BMI 25.52 kg/m    Physical Exam Musculoskeletal:     Left lower leg: No swelling.     Comments:  No cords, pain upon palpation of the left popliteal area, Homans' sign negative.          Results:   Studies obtained and personally reviewed by me:  Labs:       Component Value Date/Time   NA  137 08/17/2023 0915   K 4.5 08/17/2023 0915   CL 102 08/17/2023 0915   CO2 24 08/17/2023 0915   GLUCOSE 89 08/17/2023 0915   BUN 9 08/17/2023 0915   CREATININE 0.76 08/17/2023 0915   CALCIUM 9.8 08/17/2023 0915   PROT 7.7 08/17/2023 0915   ALBUMIN 3.8 05/06/2016 1023   AST 21 08/17/2023 0915   ALT 14 08/17/2023 0915   ALKPHOS 72 05/06/2016 1023   BILITOT 0.7 08/17/2023 0915   GFRNONAA 85 07/24/2020 0942   GFRAA 99 07/24/2020 0942     Lab Results  Component Value Date   WBC 5.0 08/17/2023   HGB 13.2 08/17/2023   HCT 43.4 08/17/2023   MCV 77.1 (L) 08/17/2023   PLT 234 08/17/2023    Lab Results  Component Value Date   CHOL 231 (H) 08/17/2023   HDL 104 08/17/2023   LDLCALC 109 (H) 08/17/2023   TRIG 90 08/17/2023   CHOLHDL 2.2 08/17/2023     Lab Results  Component Value Date   TSH 2.71 08/17/2023  Assessment & Plan:  No orders of the defined types were placed in this encounter.   Meds ordered this encounter  Medications   meloxicam  (MOBIC ) 15 MG tablet    Sig: Take 1 tablet (15 mg total) by mouth daily.    Dispense:  30 tablet    Refill:  0    Left Leg pain: She woke up this morning with a cramp in the calf of her left leg, after 30 minutes it was still sore. She denies causing injury to the area. She is concerned that she might have a clot in her leg. Her leg is still sore and she is limping when she walks around. No cords, pain upon palpation of the left popliteal area, Homans' sign negative.    Vascular ultrasound lower extremity ordered.     Meloxicam  15 mg daily prescribed.     I,Makayla C Reid,acting as a scribe for Ronal JINNY Hailstone, MD.,have documented all relevant documentation on the behalf of Ronal JINNY Hailstone, MD,as directed by  Ronal JINNY Hailstone, MD while in the presence of Ronal JINNY Hailstone, MD.   I, Ronal JINNY Hailstone, MD, have reviewed all documentation for this visit. The documentation on 04/25/2024 for the exam, diagnosis, procedures, and orders are all  accurate and complete.    Addendum: LE Doppler study done Feb 6th is normal. Patient notified of result.   "

## 2024-04-26 ENCOUNTER — Ambulatory Visit: Payer: Self-pay | Admitting: Internal Medicine

## 2024-04-26 ENCOUNTER — Ambulatory Visit (HOSPITAL_COMMUNITY): Admission: RE | Admit: 2024-04-26

## 2024-04-26 DIAGNOSIS — M79605 Pain in left leg: Secondary | ICD-10-CM

## 2024-04-26 NOTE — Patient Instructions (Signed)
 Take Meloxicam  15 mg daily as needed for pain.

## 2024-08-22 ENCOUNTER — Other Ambulatory Visit: Payer: Self-pay

## 2024-08-23 ENCOUNTER — Ambulatory Visit: Payer: Self-pay | Admitting: Internal Medicine
